# Patient Record
Sex: Female | Born: 1950 | Race: White | Hispanic: No | Marital: Single | Smoking: Never smoker
Health system: Southern US, Community
[De-identification: ages and names within clinical notes are randomized; demographics above are authoritative.]

## PROBLEM LIST (undated history)

## (undated) DIAGNOSIS — M810 Age-related osteoporosis without current pathological fracture: Secondary | ICD-10-CM

## (undated) DIAGNOSIS — E785 Hyperlipidemia, unspecified: Secondary | ICD-10-CM

## (undated) DIAGNOSIS — I341 Nonrheumatic mitral (valve) prolapse: Secondary | ICD-10-CM

## (undated) DIAGNOSIS — M1990 Unspecified osteoarthritis, unspecified site: Secondary | ICD-10-CM

## (undated) DIAGNOSIS — T7840XA Allergy, unspecified, initial encounter: Secondary | ICD-10-CM

## (undated) HISTORY — DX: Allergy, unspecified, initial encounter: T78.40XA

## (undated) HISTORY — PX: TONSILLECTOMY AND ADENOIDECTOMY: SHX28

## (undated) HISTORY — PX: BREAST EXCISIONAL BIOPSY: SUR124

## (undated) HISTORY — DX: Unspecified osteoarthritis, unspecified site: M19.90

## (undated) HISTORY — PX: APPENDECTOMY: SHX54

## (undated) HISTORY — PX: VEIN SURGERY: SHX48

## (undated) HISTORY — DX: Age-related osteoporosis without current pathological fracture: M81.0

## (undated) HISTORY — DX: Hyperlipidemia, unspecified: E78.5

## (undated) HISTORY — PX: FOOT SURGERY: SHX648

---

## 2004-05-20 ENCOUNTER — Ambulatory Visit: Payer: Self-pay

## 2005-02-28 ENCOUNTER — Ambulatory Visit: Admission: RE | Admit: 2005-02-28 | Discharge: 2005-02-28 | Payer: Self-pay | Admitting: Gynecologic Oncology

## 2007-07-04 HISTORY — PX: VASCULAR SURGERY: SHX849

## 2011-05-12 ENCOUNTER — Other Ambulatory Visit: Payer: Self-pay | Admitting: Orthopedic Surgery

## 2011-05-12 DIAGNOSIS — M25562 Pain in left knee: Secondary | ICD-10-CM

## 2011-05-13 ENCOUNTER — Other Ambulatory Visit: Payer: Self-pay

## 2011-05-15 ENCOUNTER — Ambulatory Visit
Admission: RE | Admit: 2011-05-15 | Discharge: 2011-05-15 | Disposition: A | Discharge status: Home / Self Care | Payer: BC Managed Care – PPO | Source: Ambulatory Visit | Attending: Orthopedic Surgery | Admitting: Orthopedic Surgery

## 2011-05-15 ENCOUNTER — Other Ambulatory Visit: Payer: Self-pay | Admitting: Orthopedic Surgery

## 2011-05-15 DIAGNOSIS — M25562 Pain in left knee: Secondary | ICD-10-CM

## 2011-05-15 MED ORDER — GADOBENATE DIMEGLUMINE 529 MG/ML IV SOLN
10.0000 mL | Freq: Once | INTRAVENOUS | Status: AC | PRN
  Administered 2011-05-15: 10 mL via INTRAVENOUS

## 2011-05-18 ENCOUNTER — Other Ambulatory Visit: Payer: Self-pay

## 2011-05-18 DIAGNOSIS — E785 Hyperlipidemia, unspecified: Secondary | ICD-10-CM | POA: Insufficient documentation

## 2011-05-18 DIAGNOSIS — I341 Nonrheumatic mitral (valve) prolapse: Secondary | ICD-10-CM | POA: Insufficient documentation

## 2012-04-18 ENCOUNTER — Ambulatory Visit: Payer: Self-pay | Admitting: Internal Medicine

## 2012-04-19 ENCOUNTER — Ambulatory Visit: Payer: Self-pay | Admitting: Internal Medicine

## 2012-04-24 ENCOUNTER — Other Ambulatory Visit: Payer: Self-pay | Admitting: Internal Medicine

## 2012-04-24 DIAGNOSIS — IMO0002 Reserved for concepts with insufficient information to code with codable children: Secondary | ICD-10-CM

## 2012-04-29 ENCOUNTER — Other Ambulatory Visit: Payer: Self-pay | Admitting: Internal Medicine

## 2012-04-29 DIAGNOSIS — IMO0002 Reserved for concepts with insufficient information to code with codable children: Secondary | ICD-10-CM

## 2012-04-30 ENCOUNTER — Ambulatory Visit
Admission: RE | Admit: 2012-04-30 | Discharge: 2012-04-30 | Disposition: A | Discharge status: Home / Self Care | Payer: BC Managed Care – PPO | Source: Ambulatory Visit | Attending: Internal Medicine | Admitting: Internal Medicine

## 2012-04-30 DIAGNOSIS — IMO0002 Reserved for concepts with insufficient information to code with codable children: Secondary | ICD-10-CM

## 2012-04-30 MED ORDER — GADOBENATE DIMEGLUMINE 529 MG/ML IV SOLN
12.0000 mL | Freq: Once | INTRAVENOUS | Status: AC | PRN
  Administered 2012-04-30: 12 mL via INTRAVENOUS

## 2012-06-24 ENCOUNTER — Ambulatory Visit: Payer: Self-pay | Admitting: Unknown Physician Specialty

## 2012-06-25 LAB — PATHOLOGY REPORT

## 2013-11-25 ENCOUNTER — Ambulatory Visit (INDEPENDENT_AMBULATORY_CARE_PROVIDER_SITE_OTHER): Payer: BC Managed Care – PPO | Admitting: Podiatry

## 2013-11-25 ENCOUNTER — Encounter: Payer: Self-pay | Admitting: Podiatry

## 2013-11-25 ENCOUNTER — Ambulatory Visit (INDEPENDENT_AMBULATORY_CARE_PROVIDER_SITE_OTHER): Payer: BC Managed Care – PPO

## 2013-11-25 VITALS — BP 116/84 | HR 70 | Resp 16

## 2013-11-25 DIAGNOSIS — M21619 Bunion of unspecified foot: Secondary | ICD-10-CM

## 2013-11-25 DIAGNOSIS — M7750 Other enthesopathy of unspecified foot: Secondary | ICD-10-CM

## 2013-11-25 NOTE — Progress Notes (Signed)
Subjective:     Patient ID: Rebekah Shaw, female   DOB: [birth year], 63 y.o.   MRN: [id number]  HPI patient presents stating that I get pain in my right foot at times and tiredness but I have note trouble with the pain or no trouble with the structural correction   Review of Systems  All other systems reviewed and are negative.      Objective:   Physical Exam  Nursing note and vitals reviewed. Cardiovascular: Intact distal pulses.   Musculoskeletal: Normal range of motion.   neurovascular status is intact and I noted that the incision site on the first metatarsal right and left foot is healing very well with no prominent pin position or irritation noted. Mild discomfort underneath the lesser metatarsophalangeal joints diffuse in nature which is generally been taken care of by orthotics which have started to wear out at this time     Assessment:     Doing well structural correction of both feet with inflammation noted in the lesser metatarsals right over left    Plan:     Reviewed condition and x-rays of right foot and went ahead today and scanned for custom orthotic devices. Reappoint when those are returned

## 2013-12-03 ENCOUNTER — Encounter: Payer: Self-pay | Admitting: *Deleted

## 2013-12-03 NOTE — Progress Notes (Signed)
Sent pt post card letting her know orthotics are here. 

## 2013-12-05 ENCOUNTER — Encounter: Payer: Self-pay | Admitting: Podiatry

## 2014-11-27 DIAGNOSIS — E559 Vitamin D deficiency, unspecified: Secondary | ICD-10-CM | POA: Insufficient documentation

## 2015-10-19 ENCOUNTER — Inpatient Hospital Stay
Admission: RE | Admit: 2015-10-19 | Discharge: 2015-10-19 | Disposition: A | Discharge status: Home / Self Care | Payer: Self-pay | Source: Ambulatory Visit | Attending: *Deleted | Admitting: *Deleted

## 2015-10-19 ENCOUNTER — Other Ambulatory Visit: Payer: Self-pay | Admitting: *Deleted

## 2015-10-19 ENCOUNTER — Other Ambulatory Visit: Payer: Self-pay | Admitting: Internal Medicine

## 2015-10-19 DIAGNOSIS — Z1231 Encounter for screening mammogram for malignant neoplasm of breast: Secondary | ICD-10-CM

## 2015-10-19 DIAGNOSIS — Z9289 Personal history of other medical treatment: Secondary | ICD-10-CM

## 2015-10-26 ENCOUNTER — Other Ambulatory Visit: Payer: Self-pay | Admitting: Internal Medicine

## 2015-10-26 ENCOUNTER — Ambulatory Visit
Admission: RE | Admit: 2015-10-26 | Discharge: 2015-10-26 | Disposition: A | Discharge status: Home / Self Care | Payer: BLUE CROSS/BLUE SHIELD | Source: Ambulatory Visit | Attending: Internal Medicine | Admitting: Internal Medicine

## 2015-10-26 DIAGNOSIS — Z1231 Encounter for screening mammogram for malignant neoplasm of breast: Secondary | ICD-10-CM | POA: Insufficient documentation

## 2016-11-16 ENCOUNTER — Other Ambulatory Visit: Payer: Self-pay | Admitting: Internal Medicine

## 2016-11-16 DIAGNOSIS — Z1231 Encounter for screening mammogram for malignant neoplasm of breast: Secondary | ICD-10-CM

## 2016-12-19 ENCOUNTER — Ambulatory Visit
Admission: RE | Admit: 2016-12-19 | Discharge: 2016-12-19 | Disposition: A | Discharge status: Home / Self Care | Payer: Medicare HMO | Source: Ambulatory Visit | Attending: Internal Medicine | Admitting: Internal Medicine

## 2016-12-19 DIAGNOSIS — Z1231 Encounter for screening mammogram for malignant neoplasm of breast: Secondary | ICD-10-CM | POA: Diagnosis present

## 2017-03-13 ENCOUNTER — Other Ambulatory Visit: Payer: Self-pay | Admitting: Internal Medicine

## 2017-03-13 DIAGNOSIS — R29898 Other symptoms and signs involving the musculoskeletal system: Secondary | ICD-10-CM

## 2017-03-23 ENCOUNTER — Ambulatory Visit
Admission: RE | Admit: 2017-03-23 | Discharge: 2017-03-23 | Disposition: A | Discharge status: Home / Self Care | Payer: Medicare HMO | Source: Ambulatory Visit | Attending: Internal Medicine | Admitting: Internal Medicine

## 2017-03-23 DIAGNOSIS — R29898 Other symptoms and signs involving the musculoskeletal system: Secondary | ICD-10-CM

## 2017-07-03 DIAGNOSIS — M1990 Unspecified osteoarthritis, unspecified site: Secondary | ICD-10-CM

## 2017-07-03 HISTORY — DX: Unspecified osteoarthritis, unspecified site: M19.90

## 2017-08-17 ENCOUNTER — Ambulatory Visit: Payer: Medicare HMO | Admitting: Podiatry

## 2017-08-17 ENCOUNTER — Encounter: Payer: Self-pay | Admitting: Podiatry

## 2017-08-17 ENCOUNTER — Ambulatory Visit (INDEPENDENT_AMBULATORY_CARE_PROVIDER_SITE_OTHER): Payer: Medicare HMO

## 2017-08-17 ENCOUNTER — Other Ambulatory Visit: Payer: Self-pay | Admitting: Podiatry

## 2017-08-17 DIAGNOSIS — M79671 Pain in right foot: Secondary | ICD-10-CM

## 2017-08-17 DIAGNOSIS — R600 Localized edema: Secondary | ICD-10-CM

## 2017-08-18 NOTE — Progress Notes (Signed)
Subjective:   Patient ID: Rebekah Shaw, female   DOB: 67 y.o.   MRN: [id number]   HPI Patient presents stating that she started to develop discomfort in her right foot and she has a history of lymphedema that she utilizes a compression device for.  States her foot has started to swell over the last few days and does not remember specific injury   Review of Systems  All other systems reviewed and are negative.       Objective:  Physical Exam  Constitutional: She appears well-developed and well-nourished.  Cardiovascular: Intact distal pulses.  Pulmonary/Chest: Effort normal.  Musculoskeletal: Normal range of motion.  Neurological: She is alert.  Skin: Skin is warm.  Nursing note and vitals reviewed.   Neurovascular status found to be intact muscle strength is adequate range of motion was within normal limits with negative Homans sign noted and patient found to have quite a bit of edema in the right foot and into the ankle that is localized with no breakage of skin or indications of pathology.  Structural bunion correction right looks good with good alignment noted     Assessment:  Swelling which may be due to lymphedema or other pathology with no indications currently of any kind of blockage with good structural correction of bunion     Plan:  H&P education rendered and today I went ahead and applied an Unna boot Ace wrap to try to compress the area and take the swelling out.  I instructed on leaving it on for 4 days and I gave strict instructions if she should develop any discomfort or proximal swelling or any indications of clot or any shortness of breath she is to go straight to the emergency room.  Patient will be seen back if symptoms persist  X-rays indicate good alignment first metatarsal with excellent correction from previous surgery that was done years ago

## 2017-08-23 ENCOUNTER — Encounter: Payer: Self-pay | Admitting: Podiatry

## 2017-08-23 ENCOUNTER — Ambulatory Visit: Payer: Medicare HMO | Admitting: Podiatry

## 2017-08-23 DIAGNOSIS — M84374D Stress fracture, right foot, subsequent encounter for fracture with routine healing: Secondary | ICD-10-CM | POA: Diagnosis not present

## 2017-08-23 DIAGNOSIS — M79671 Pain in right foot: Secondary | ICD-10-CM

## 2017-08-23 DIAGNOSIS — S93601D Unspecified sprain of right foot, subsequent encounter: Secondary | ICD-10-CM

## 2017-08-23 NOTE — Progress Notes (Addendum)
This patient presents to the office at [facility] insisting on an evaluation of her right foot.  She says she was seen by Dr. Charlsie [place] in [place] 4 days ago.  He was concerned about her swelling and lymphedema and recommended she wear an Radio broadcast assistantUnna boot for 4 days.  She presents the office today to have her Unna boot removed.  Upon removal, there was no evidence of any redness or swelling persisting in her right foot.  She says she was dispensed a medium-sized surgical shoe and she had difficulty keeping her balance since she believes that she needed a small size surgical shoe.  She presents the office to exchange her surgical shoe today.  She also requests a reevaluation of her right foot since pain persists.  She is presently using a pump for her lymphedma.  General Appearance  Alert, conversant and in no acute stress.  Vascular  Dorsalis pedis and posterior pulses are palpable  bilaterally.  Capillary return is within normal limits  bilaterally. Temperature is within normal limits  Bilaterally.  Neurologic  Senn-Weinstein monofilament wire test within normal limits  bilaterally. Muscle power within normal limits bilaterally.  Nails Normotropic nails noted with no evidence of fungal or bacterial infection.  Orthopedic  No limitations of motion of motion feet bilaterally.  No crepitus or effusions noted.  No bony pathology or digital deformities noted. Examination of her right foot reveals palpable pain noted to the cuboid and along the plantar aspect of the fifth metatarsal right foot.  There is also palpable pain noted midshaft second metatarsal right foot.  No evidence of any swelling noted.  Skin  normotropic skin with no porokeratosis noted bilaterally.  No signs of infections or ulcers noted.    Foot Sprain right foot  ROV  Reexamination of the x-ray of the AP  Xray reveals an enlarged cuboid bone.  Lateral reveals a normal appearing  Cuboid.  Patient does have pain and discomfort in the cuboid area  and along the shaft of the fifth metatarsal.  She denies any trauma or injury to this foot.  All swelling has resolved with the [employer] LockerUnna boot.  I recommended plantar fascia brace to be worn on her right foot to help lock the midfoot complex during gait.  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  This could help explain the pain in the second metatarsal midshaft.  She could have been compensating for the cuboid syndrome and placing her weight through the medial aspect of the foot.  This would explain the swelling at the first metatarsal.  [facility]  She was told to wear her plantar fascial brace as well her compression sock.  Compression sock is to limit her previous swelling and help stailize her right foot RTC 10 days.

## 2017-08-29 ENCOUNTER — Ambulatory Visit (INDEPENDENT_AMBULATORY_CARE_PROVIDER_SITE_OTHER): Payer: Medicare HMO | Admitting: Orthotics

## 2017-08-29 DIAGNOSIS — M79676 Pain in unspecified toe(s): Secondary | ICD-10-CM

## 2017-08-29 DIAGNOSIS — M84374D Stress fracture, right foot, subsequent encounter for fracture with routine healing: Secondary | ICD-10-CM

## 2017-08-29 DIAGNOSIS — S93601D Unspecified sprain of right foot, subsequent encounter: Secondary | ICD-10-CM

## 2017-08-29 DIAGNOSIS — M79671 Pain in right foot: Secondary | ICD-10-CM

## 2017-08-29 NOTE — Progress Notes (Signed)
Patient presented today for CMFO to address cuboid syndrome (R).  Patient was confused re whether insurance would pay as she has Medicare advantage plan.  She eventually decided to pay $300 (125 down).  [place] to fab

## 2017-09-06 ENCOUNTER — Ambulatory Visit: Payer: Medicare HMO | Admitting: Podiatry

## 2017-09-10 ENCOUNTER — Ambulatory Visit: Payer: Medicare HMO | Admitting: Podiatry

## 2017-09-10 ENCOUNTER — Encounter: Payer: Self-pay | Admitting: Podiatry

## 2017-09-10 DIAGNOSIS — M79671 Pain in right foot: Secondary | ICD-10-CM

## 2017-09-10 DIAGNOSIS — M2041 Other hammer toe(s) (acquired), right foot: Secondary | ICD-10-CM

## 2017-09-10 DIAGNOSIS — M2011 Hallux valgus (acquired), right foot: Secondary | ICD-10-CM | POA: Diagnosis not present

## 2017-09-10 NOTE — Progress Notes (Signed)
This patient returns to the office follow-up for an evaluation of her right foot.  She was diagnosed by myself as having a cuboid syndrome.   Patient states that she has been wearing her compression sock with her surgical shoe. She was unable to wearantar fascial brace on her right foot.   She says that she is under percent improved and all the pain has resolved.  She also is having no pain or discomfort at the second metatarsal of the right foot.  Patient is concerned about her big toe and her second toe right foot.  She says she previously had bunion surgery years ago and she is concerned that the bunion is returning.  She says the big toe on the right foot is drawing towards the second toe on the right foot and causing the second toe to be lifted.She is concerned that this will continue to develop leading to future problems.  She returns to the office for continued evaluation  and treatment of her right foot   General Appearance  Alert, conversant and in no acute stress.  Vascular  Dorsalis pedis and posterior tibial  pulses are palpable  bilaterally.  Capillary return is within normal limits  bilaterally. Temperature is within normal limits  bilaterally.  Neurologic  Senn-Weinstein monofilament wire test within normal limits  bilaterally. Muscle power within normal limits bilaterally.  Nails Thick disfigured discolored nails with subungual debris  from hallux to fifth toes bilaterally. No evidence of bacterial infection or drainage bilaterally.  Orthopedic  No limitations of motion of motion feet .  No crepitus or effusions noted.  Minimal  pain noted at the base of the fifth metatarsal and cuboid bone, right foot.   No evidence of any swelling.  No pain or swelling noted second metatarsal right foot.  Mild hallux interphalangeus with dorsiflexed position second toe.    Skin  normotropic skin with no porokeratosis noted bilaterally.  No signs of infections or ulcers noted.    S/P foot sprain   Hallux interphalangeus and hammer toe second toe right foot.  ROV.  Evaluation of her foot sprain reveals marked improvement.  She desired to discuss her forefoot pathology, which included her second digit right foot.  I recommended she buddy tape her second to her third toe, right foot.  After putting it on. She says there is no way she could buddy tape her toes herself.  We discussed surgical correction in the future.  Finally, she was told to continue wearing the compression sock with her footgear on her right foot.  Return to clinic when necessary   Rebekah Shaw DPM

## 2017-09-19 ENCOUNTER — Ambulatory Visit (INDEPENDENT_AMBULATORY_CARE_PROVIDER_SITE_OTHER): Payer: Medicare HMO | Admitting: Orthotics

## 2017-09-19 DIAGNOSIS — M84374D Stress fracture, right foot, subsequent encounter for fracture with routine healing: Secondary | ICD-10-CM

## 2017-09-19 DIAGNOSIS — M2041 Other hammer toe(s) (acquired), right foot: Secondary | ICD-10-CM

## 2017-09-19 NOTE — Progress Notes (Signed)
Patient came in today to pick up custom made foot orthotics.  The goals were accomplished and the patient reported no dissatisfaction with said orthotics.  Patient was advised of breakin period and how to report any issues. 

## 2017-11-23 ENCOUNTER — Other Ambulatory Visit: Payer: Self-pay | Admitting: Student

## 2017-11-23 DIAGNOSIS — M545 Low back pain: Principal | ICD-10-CM

## 2017-11-23 DIAGNOSIS — G8929 Other chronic pain: Secondary | ICD-10-CM

## 2017-12-12 ENCOUNTER — Other Ambulatory Visit: Payer: Self-pay | Admitting: Internal Medicine

## 2017-12-12 DIAGNOSIS — Z1231 Encounter for screening mammogram for malignant neoplasm of breast: Secondary | ICD-10-CM

## 2018-01-01 ENCOUNTER — Ambulatory Visit
Admission: RE | Admit: 2018-01-01 | Discharge: 2018-01-01 | Disposition: A | Discharge status: Home / Self Care | Payer: Medicare HMO | Source: Ambulatory Visit | Attending: Internal Medicine | Admitting: Internal Medicine

## 2018-01-01 DIAGNOSIS — Z1231 Encounter for screening mammogram for malignant neoplasm of breast: Secondary | ICD-10-CM | POA: Diagnosis present

## 2018-01-10 ENCOUNTER — Other Ambulatory Visit: Payer: Self-pay

## 2018-01-10 ENCOUNTER — Ambulatory Visit
Payer: Medicare HMO | Attending: Student in an Organized Health Care Education/Training Program | Admitting: Student in an Organized Health Care Education/Training Program

## 2018-01-10 ENCOUNTER — Encounter: Payer: Self-pay | Admitting: Student in an Organized Health Care Education/Training Program

## 2018-01-10 VITALS — BP 133/81 | HR 80 | Temp 98.2°F | Resp 16 | Ht 60.0 in | Wt 150.0 lb

## 2018-01-10 DIAGNOSIS — M47816 Spondylosis without myelopathy or radiculopathy, lumbar region: Secondary | ICD-10-CM | POA: Insufficient documentation

## 2018-01-10 DIAGNOSIS — M5416 Radiculopathy, lumbar region: Secondary | ICD-10-CM | POA: Diagnosis not present

## 2018-01-10 DIAGNOSIS — X58XXXS Exposure to other specified factors, sequela: Secondary | ICD-10-CM | POA: Insufficient documentation

## 2018-01-10 DIAGNOSIS — Z791 Long term (current) use of non-steroidal anti-inflammatories (NSAID): Secondary | ICD-10-CM | POA: Insufficient documentation

## 2018-01-10 DIAGNOSIS — Z9889 Other specified postprocedural states: Secondary | ICD-10-CM | POA: Insufficient documentation

## 2018-01-10 DIAGNOSIS — S32050S Wedge compression fracture of fifth lumbar vertebra, sequela: Secondary | ICD-10-CM | POA: Insufficient documentation

## 2018-01-10 DIAGNOSIS — M4856XS Collapsed vertebra, not elsewhere classified, lumbar region, sequela of fracture: Secondary | ICD-10-CM | POA: Diagnosis not present

## 2018-01-10 DIAGNOSIS — M25551 Pain in right hip: Secondary | ICD-10-CM | POA: Insufficient documentation

## 2018-01-10 DIAGNOSIS — G894 Chronic pain syndrome: Secondary | ICD-10-CM | POA: Insufficient documentation

## 2018-01-10 DIAGNOSIS — Z79899 Other long term (current) drug therapy: Secondary | ICD-10-CM | POA: Diagnosis not present

## 2018-01-10 DIAGNOSIS — M5136 Other intervertebral disc degeneration, lumbar region: Secondary | ICD-10-CM | POA: Insufficient documentation

## 2018-01-10 DIAGNOSIS — M4726 Other spondylosis with radiculopathy, lumbar region: Secondary | ICD-10-CM | POA: Insufficient documentation

## 2018-01-10 MED ORDER — GABAPENTIN 100 MG PO CAPS: ORAL_CAPSULE | ORAL | 0 refills | Status: DC

## 2018-01-10 MED ORDER — DICLOFENAC SODIUM 75 MG PO TBEC: 75.0000 mg | DELAYED_RELEASE_TABLET | Freq: Two times a day (BID) | ORAL | 0 refills | Status: DC

## 2018-01-10 MED ORDER — OMEPRAZOLE 20 MG PO CPDR: 20.0000 mg | DELAYED_RELEASE_CAPSULE | Freq: Every day | ORAL | 1 refills | Status: DC

## 2018-01-10 NOTE — Progress Notes (Signed)
Patient's Name: Rebekah Shaw  MRN: [id number]  Referring Provider: Marin Olp, PA-C  DOB: 18-Feb-1951  PCP: Adin Hector, MD  DOS: 01/10/2018  Note by: Gillis Santa, MD  Service setting: Ambulatory outpatient  Specialty: Interventional Pain Management  Location: [facility] (AMB) Pain Management Facility  Visit type: Initial Patient Evaluation  Patient type: New Patient   Primary Reason(s) for Visit: Encounter for initial evaluation of one or more chronic problems (new to examiner) potentially causing chronic pain, and posing a threat to normal musculoskeletal function. (Level of risk: High) CC: Back Pain (lower, right) and Hip Pain (hip)  HPI  [name] is a 67 y.o. year old, female patient, who comes today to see [place] for the first time for an initial evaluation of her chronic pain. She has Lumbar radiculopathy (Right L4/5); Lumbar spondylosis; Lumbar facet arthropathy; Lumbar degenerative disc disease; Compression fracture of L5 lumbar vertebra, sequela; and Chronic pain syndrome on their problem list. Today she comes in for evaluation of her Back Pain (lower, right) and Hip Pain (hip)  Pain Assessment: Location: Right, Lower Back Radiating: through right hip down side of right leg to foot, including all toes Onset: More than a month ago Duration: Chronic pain Quality: Aching, Throbbing, Shooting, Constant Severity: 9 /10 (subjective, self-reported pain score)  Note: Reported level is compatible with observation.                         When using our objective Pain Scale, levels between 6 and 10/10 are said to belong in an emergency room, as it progressively worsens from a 6/10, described as severely limiting, requiring emergency care not usually available at an outpatient pain management facility. At a 6/10 level, communication becomes difficult and requires great effort. Assistance to reach the emergency department may be required. Facial flushing and profuse sweating along with potentially  dangerous increases in heart rate and blood pressure will be evident. Effect on ADL: requiring walk aids, has gained 30# in past year due to not able to exercise r/t pain, difficult to navigate steps in pt's 2 story house Timing: Constant Modifying factors: voltaren cream, ibuprofen 800 mg BP: 133/81  HR: 80  Onset and Duration: Date of onset: 10/2016 and Present longer than 3 months Cause of pain: Work related accident or event Severity: Getting worse and No change since onset Timing: Morning, Afternoon and Night Aggravating Factors: Bending, Lifiting, Motion, Prolonged sitting, Prolonged standing, Squatting, Twisting, Walking, Walking uphill, Walking downhill and Working Alleviating Factors: Lying down Associated Problems: Constipation, Day-time cramps, Night-time cramps, Fatigue, Inability to control bladder (urine), Numbness, Personality changes, Sweating, Swelling, Temperature changes, Weakness and Pain that wakes patient up Quality of Pain: Aching, Exhausting, Feeling of weight, Getting longer, Getting shorter, Itching, Sharp, Sickening, Splitting, Stabbing, Tender, Throbbing, Tiring, Uncomfortable and Work related Previous Examinations or Tests: MRI scan Previous Treatments: The patient denies none listed  The patient comes into the clinics today for the first time for a chronic pain management evaluation.   67 year old female with a chief complaint of back pain that radiates into her right leg that is been present for greater than 2 years that has really worsened after a re-injury .  Patient works at [employer] and finds it very difficult to stand freely without leaning on the counter.  She has difficulty applying weight to her right side.  She is status post left intra-articular hip injection with Dr. Sharlet Salina in February 2019 which was not  beneficial.  Patient states that her in terms of medications, patient is tried baclofen which is resulted in nausea and prednisone which resulted in  diarrhea and vomiting.  Patient denies receiving any epidural injections or facet blocks in the past.  She has recently completed a lumbar MRI results which are below.  Patient has been utilizing ibuprofen 600 800 mg twice daily to help manage her pain.  She denies any bowel or bladder dysfunction.  She has difficulty ambulating without an assist device given her right low back hip and radiating leg pain.  She has done physical therapy in the past states that it was not beneficial.  Currently not on any opioid therapy.  PMP reveals no opioid prescriptions.  Today I took the time to provide the patient with information regarding my pain practice. The patient was informed that my practice is divided into two sections: an interventional pain management section, as well as a completely separate and distinct medication management section. I explained that I have procedure days for my interventional therapies, and evaluation days for follow-ups and medication management. Because of the amount of documentation required during both, they are kept separated. This means that there is the possibility that she may be scheduled for a procedure on one day, and medication management the next. I have also informed her that because of staffing and facility limitations, I no longer take patients for medication management only. To illustrate the reasons for this, I gave the patient the example of surgeons, and how inappropriate it would be to refer a patient to his/her care, just to write for the post-surgical antibiotics on a surgery done by a different surgeon.   Because interventional pain management is my board-certified specialty, the patient was informed that joining my practice means that they are open to any and all interventional therapies. I made it clear that this does not mean that they will be forced to have any procedures done. What this means is that I believe interventional therapies to be essential part of the  diagnosis and proper management of chronic pain conditions. Therefore, patients not interested in these interventional alternatives will be better served under the care of a different practitioner.  The patient was also made aware of my Comprehensive Pain Management Safety Guidelines where by joining my practice, they limit all of their nerve blocks and joint injections to those done by our practice, for as long as we are retained to manage their care.   Historic Controlled Substance Pharmacotherapy Review  PMP and historical list of controlled substances: 0  Pharmacodynamics: Desired effects: Reported improvement in function: The patient reports medication allows her to accomplish basic ADLs. Clinically meaningful improvement in function (CMIF): Sustained CMIF goals met Perceived effectiveness: Described as relatively effective, allowing for increase in activities of daily living (ADL) Undesirable effects: Side-effects or Adverse reactions: None reported Historical Monitoring: The patient  reports that she does not use drugs. List of all UDS Test(s): No results found for: MDMA, COCAINSCRNUR, [place], [place], CANNABQUANT, [place], [place] List of other Serum/Urine Drug Screening Test(s):  No results found for: AMPHSCRSER, BARBSCRSER, BENZOSCRSER, COCAINSCRSER, COCAINSCRNUR, PCPSCRSER, PCPQUANT, THCSCRSER, THCU, CANNABQUANT, OPIATESCRSER, OXYSCRSER, PROPOXSCRSER, ETH Historical Background Evaluation: [place] PMP: Six (6) year initial data search conducted.             [place] Department of public safety, offender search: Editor, commissioning Information) Non-contributory Risk Assessment Profile: Aberrant behavior: None observed or detected today Risk factors for fatal opioid overdose: None identified today Fatal overdose hazard  ratio (HR): Calculation deferred Non-fatal overdose hazard ratio (HR): Calculation deferred Risk of opioid abuse or dependence: 0.7-3.0% with doses ? 36 MME/day and 6.1-26% with doses ? 120  MME/day. Substance use disorder (SUD) risk level: Low Opioid risk tool (ORT) (Total Score): 0 Opioid Risk Tool - 01/10/18 0820      Family History of Substance Abuse   Alcohol  Negative    Illegal Drugs  Negative    Rx Drugs  Negative      Personal History of Substance Abuse   Alcohol  Negative    Illegal Drugs  Negative    Rx Drugs  Negative      Age   Age between 54-45 years   No      History of Preadolescent Sexual Abuse   History of Preadolescent Sexual Abuse  Negative or Female      Psychological Disease   Psychological Disease  Negative    Depression  Negative      Total Score   Opioid Risk Tool Scoring  0    Opioid Risk Interpretation  Low Risk      ORT Scoring interpretation table:  Score <3 = Low Risk for SUD  Score between 4-7 = Moderate Risk for SUD  Score >8 = High Risk for Opioid Abuse   PHQ-2 Depression Scale:  Total score: 0  PHQ-2 Scoring interpretation table: (Score and probability of major depressive disorder)  Score 0 = No depression  Score 1 = 15.4% Probability  Score 2 = 21.1% Probability  Score 3 = 38.4% Probability  Score 4 = 45.5% Probability  Score 5 = 56.4% Probability  Score 6 = 78.6% Probability   PHQ-9 Depression Scale:  Total score: 0  PHQ-9 Scoring interpretation table:  Score 0-4 = No depression  Score 5-9 = Mild depression  Score 10-14 = Moderate depression  Score 15-19 = Moderately severe depression  Score 20-27 = Severe depression (2.4 times higher risk of SUD and 2.89 times higher risk of overuse)   Pharmacologic Plan: As per protocol, I have not taken over any controlled substance management, pending the results of ordered tests and/or consults.            Initial impression: Pending review of available data and ordered tests.  Meds   Current Outpatient Medications:  .  b complex vitamins tablet, Take 1 tablet by mouth daily., Disp: , Rfl:  .  BIOTIN 5000 PO, Take by mouth., Disp: , Rfl:  .  diclofenac sodium (VOLTAREN)  1 % GEL, Apply topically 4 (four) times daily., Disp: , Rfl:  .  fexofenadine (ALLEGRA) 180 MG tablet, Take 180 mg by mouth daily., Disp: , Rfl:  .  ibuprofen (ADVIL,MOTRIN) 800 MG tablet, Take 800 mg by mouth every 8 (eight) hours as needed., Disp: , Rfl:  .  Magnesium 400 MG CAPS, Take by mouth., Disp: , Rfl:  .  multivitamin-iron-minerals-folic acid (CENTRUM) chewable tablet, Chew 1 tablet by mouth daily., Disp: , Rfl:  .  Omega-3 Fatty Acids (FISH OIL) 1200 MG CAPS, Take by mouth., Disp: , Rfl:  .  UNABLE TO FIND, Take by mouth. folic acid/multivit-min/lutein (CENTRUM SILVER ORAL), Disp: , Rfl:  .  VITAMIN D, CHOLECALCIFEROL, PO, Take by mouth., Disp: , Rfl:  .  diclofenac (VOLTAREN) 75 MG EC tablet, Take 1 tablet (75 mg total) by mouth 2 (two) times daily after a meal., Disp: 60 tablet, Rfl: 0 .  gabapentin (NEURONTIN) 100 MG capsule, 100 mg qhs x 1  week, then 200 mg qhs for week 2, 300 mg qhs week 3, Disp: 90 capsule, Rfl: 0 .  omeprazole (PRILOSEC) 20 MG capsule, Take 1 capsule (20 mg total) by mouth daily., Disp: 30 capsule, Rfl: 1  Imaging Review   Lumbosacral Imaging: Lumbar MR wo contrast:  Results for orders placed during the hospital encounter of 03/23/17  MR LUMBAR SPINE WO CONTRAST   Narrative CLINICAL DATA:  Chronic low back pain, bilateral leg pain and swelling.  EXAM: MRI LUMBAR SPINE WITHOUT CONTRAST  TECHNIQUE: Multiplanar, multisequence MR imaging of the lumbar spine was performed. No intravenous contrast was administered.  COMPARISON:  CT abdomen and pelvis April 18, 2012  FINDINGS: SEGMENTATION: For the purposes of this report, the last well-formed intervertebral disc will be reported as L5-S1.  ALIGNMENT: Maintained lumbar lordosis. Minimal grade 1 L3-4 anterolisthesis. No spondylolysis. Mild broad dextroscoliosis inferred on axial sequences.  VERTEBRAE:Mild chronic L1 compression fracture with less than 20% superior endplate height loss. Lumbar  vertebral bodies are otherwise intact. Mild L3-4 disc height loss and chronic discogenic endplate changes. Disc desiccation all lumbar levels. No suspicious or acute bone marrow signal. 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  CONUS MEDULLARIS: Conus medullaris terminates at L1-2 and demonstrates normal morphology and signal characteristics. Cauda equina is normal.  PARASPINAL AND SOFT TISSUES: Included prevertebral and paraspinal soft tissues are nonacute. Mild rotated LEFT kidney.  DISC LEVELS:  T12-L1 thru L2-3: No disc bulge, canal stenosis nor neural foraminal narrowing.  L3-4: Anterolisthesis. Unroofing of the disc. Moderate facet arthropathy and ligamentum flavum redundancy resulting in mild canal stenosis. No neural foraminal narrowing.  L4-5: No disc bulge. Mild facet arthropathy and ligamentum flavum redundancy. 4 mm LEFT facet synovial cyst within paraspinal soft tissues. No canal stenosis or neural foraminal narrowing.  L5-S1: No disc bulge, canal stenosis nor neural foraminal narrowing. Moderate RIGHT facet arthropathy.  IMPRESSION: 1. Minimal grade 1 L3-4 anterolisthesis on degenerative basis. No spondylolysis. 2. Mild old L5 compression fracture. 3. Mild canal stenosis L3-4. No neural foraminal narrowing at any level.   Electronically Signed   By: Elon Alas M.D.   On: 03/24/2017 02:25    Knee-L MR wo contrast:  Results for orders placed during the hospital encounter of 05/15/11  MR Knee Left W Wo Contrast   Narrative *RADIOLOGY REPORT*  Clinical Data: Pain and swelling of the left knee for 3 weeks. Firm mass superior to the knee.  MRI OF THE LEFT KNEE WITHOUT AND WITH CONTRAST  Technique:  Multiplanar, multisequence MR imaging was performed both before and after administration of intravenous contrast.  Contrast: 93m MULTIHANCE GADOBENATE DIMEGLUMINE 529 MG/ML IV SOLN  Comparison: None.  Findings: The scan extends from 14 cm above the knee joint to 6  cm below the knee joint.  There is no soft tissue or osseous mass.  No pathologic enhancement after contrast administration.  Menisci are normal.  Cruciate and collateral ligaments are normal. Distal quadriceps tendon and patellar tendon are normal.  No joint effusion or popliteal cyst.  No osseous or soft tissue edema.  IMPRESSION: Normal MRI of the left knee and distal left thigh.  Original Report Authenticated By: JLarey Seat M.D.    Foot Imaging: Foot-R DG Complete:  Results for orders placed in visit on 08/17/17  DG Foot Complete Right   Narrative Please see detailed radiograph report in office note.    Hand-L DG Complete: No results found for this or any previous visit.  Complexity Note: Imaging results  reviewed. Results shared with Ms. Leppanen, using Layman's terms.                         ROS  Cardiovascular: No reported cardiovascular signs or symptoms such as High blood pressure, coronary artery disease, abnormal heart rate or rhythm, heart attack, blood thinner therapy or heart weakness and/or failure Pulmonary or Respiratory: No reported pulmonary signs or symptoms such as wheezing and difficulty taking a deep full breath (Asthma), difficulty blowing air out (Emphysema), coughing up mucus (Bronchitis), persistent dry cough, or temporary stoppage of breathing during sleep Neurological: No reported neurological signs or symptoms such as seizures, abnormal skin sensations, urinary and/or fecal incontinence, being born with an abnormal open spine and/or a tethered spinal cord Review of Past Neurological Studies: No results found for this or any previous visit. Psychological-Psychiatric: Anxiousness Gastrointestinal: Irregular, infrequent bowel movements (Constipation) Genitourinary: No reported renal or genitourinary signs or symptoms such as difficulty voiding or producing urine, peeing blood, non-functioning kidney, kidney stones, difficulty emptying the bladder,  difficulty controlling the flow of urine, or chronic kidney disease Hematological: No reported hematological signs or symptoms such as prolonged bleeding, low or poor functioning platelets, bruising or bleeding easily, hereditary bleeding problems, low energy levels due to low hemoglobin or being anemic Endocrine: No reported endocrine signs or symptoms such as high or low blood sugar, rapid heart rate due to high thyroid levels, obesity or weight gain due to slow thyroid or thyroid disease Rheumatologic: Joint aches and or swelling due to excess weight (Osteoarthritis) Musculoskeletal: Negative for myasthenia gravis, muscular dystrophy, multiple sclerosis or malignant hyperthermia Work History: Working full time  Allergies  Ms. Layne is allergic to prednisone; atorvastatin; ezetimibe; ibandronic acid; pravastatin; rosuvastatin; simvastatin; and meloxicam.  Laboratory Chemistry  Inflammation Markers (CRP: Acute Phase) (ESR: Chronic Phase) No results found for: CRP, ESRSEDRATE, LATICACIDVEN                       Rheumatology Markers No results found for: RF, ANA, LABURIC, URICUR, LYMEIGGIGMAB, LYMEABIGMQN, HLAB27                      Renal Function Markers No results found for: BUN, CREATININE, BCR, GFRAA, GFRNONAA                           Hepatic Function Markers No results found for: AST, ALT, ALBUMIN, ALKPHOS, HCVAB, AMYLASE, LIPASE, AMMONIA                      Electrolytes No results found for: NA, K, CL, CALCIUM, MG, PHOS                      Neuropathy Markers No results found for: VITAMINB12, FOLATE, HGBA1C, HIV                      Bone Pathology Markers No results found for: VD25OH, [id number], [id number], [id number], 25OHVITD1, 25OHVITD2, 25OHVITD3, TESTOFREE, TESTOSTERONE                       Coagulation Parameters No results found for: INR, LABPROT, APTT, PLT, DDIMER                      Cardiovascular Markers No results found for: BNP, CKTOTAL, CKMB, TROPONINI, HGB,  HCT  CA Markers No results found for: CEA, CA125, LABCA2                      Note: Lab results reviewed.  PFSH  Drug: Ms. Wagler  reports that she does not use drugs. Alcohol:  reports that she does not drink alcohol. Tobacco:  reports that she has never smoked. She has never used smokeless tobacco. Medical:  has a past medical history of Allergy, Arthritis (2019), Hyperlipidemia, and Osteoporosis. Family: family history includes Cancer in her father.  Past Surgical History:  Procedure Laterality Date  . APPENDECTOMY    . FOOT SURGERY    . TONSILLECTOMY AND ADENOIDECTOMY    . VASCULAR SURGERY Bilateral 2009   legs   Active Ambulatory Problems    Diagnosis Date Noted  . Lumbar radiculopathy (Right L4/5) 01/10/2018  . Lumbar spondylosis 01/10/2018  . Lumbar facet arthropathy 01/10/2018  . Lumbar degenerative disc disease 01/10/2018  . Compression fracture of L5 lumbar vertebra, sequela 01/10/2018  . Chronic pain syndrome 01/10/2018   Resolved Ambulatory Problems    Diagnosis Date Noted  . No Resolved Ambulatory Problems   Past Medical History:  Diagnosis Date  . Allergy   . Arthritis 2019  . Hyperlipidemia   . Osteoporosis    Constitutional Exam  General appearance: Well nourished, well developed, and well hydrated. In no apparent acute distress Vitals:   01/10/18 0754  BP: 133/81  Pulse: 80  Resp: 16  Temp: 98.2 F (36.8 C)  TempSrc: Oral  SpO2: 97%  Weight: 150 lb (68 kg)  Height: 5' (1.524 m)   BMI Assessment: Estimated body mass index is 29.29 kg/m as calculated from the following:   Height as of this encounter: 5' (1.524 m).   Weight as of this encounter: 150 lb (68 kg).  BMI interpretation table: BMI level Category Range association with higher incidence of chronic pain  <18 kg/m2 Underweight   18.5-24.9 kg/m2 Ideal body weight   25-29.9 kg/m2 Overweight Increased incidence by 20%  30-34.9 kg/m2 Obese (Class I) Increased  incidence by 68%  35-39.9 kg/m2 Severe obesity (Class II) Increased incidence by 136%  >40 kg/m2 Extreme obesity (Class III) Increased incidence by 254%   Patient's current BMI Ideal Body weight  Body mass index is 29.29 kg/m. Ideal body weight: 45.5 kg (100 lb 4.9 oz) Adjusted ideal body weight: 54.5 kg (120 lb 3 oz)   BMI Readings from Last 4 Encounters:  01/10/18 29.29 kg/m   Wt Readings from Last 4 Encounters:  01/10/18 150 lb (68 kg)  Psych/Mental status: Alert, oriented x 3 (person, place, & time)       Eyes: PERLA Respiratory: No evidence of acute respiratory distress  Cervical Spine Area Exam  Skin & Axial Inspection: No masses, redness, edema, swelling, or associated skin lesions Alignment: Symmetrical Functional ROM: Unrestricted ROM      Stability: No instability detected Muscle Tone/Strength: Functionally intact. No obvious neuro-muscular anomalies detected. Sensory (Neurological): Unimpaired Palpation: No palpable anomalies              Upper Extremity (UE) Exam    Side: Right upper extremity  Side: Left upper extremity  Skin & Extremity Inspection: Skin color, temperature, and hair growth are WNL. No peripheral edema or cyanosis. No masses, redness, swelling, asymmetry, or associated skin lesions. No contractures.  Skin & Extremity Inspection: Skin color, temperature, and hair growth are WNL. No peripheral edema or cyanosis. No masses, redness, swelling, asymmetry, or  associated skin lesions. No contractures.  Functional ROM: Unrestricted ROM          Functional ROM: Unrestricted ROM          Muscle Tone/Strength: Functionally intact. No obvious neuro-muscular anomalies detected.  Muscle Tone/Strength: Functionally intact. No obvious neuro-muscular anomalies detected.  Sensory (Neurological): Unimpaired          Sensory (Neurological): Unimpaired          Palpation: No palpable anomalies              Palpation: No palpable anomalies              Provocative Test(s):   Phalen's test: deferred Tinel's test: deferred Apley's scratch test (touch opposite shoulder):  Action 1 (Across chest): deferred Action 2 (Overhead): deferred Action 3 (LB reach): deferred   Provocative Test(s):  Phalen's test: deferred Tinel's test: deferred Apley's scratch test (touch opposite shoulder):  Action 1 (Across chest): deferred Action 2 (Overhead): deferred Action 3 (LB reach): deferred    Thoracic Spine Area Exam  Skin & Axial Inspection: No masses, redness, or swelling Alignment: Symmetrical Functional ROM: Unrestricted ROM Stability: No instability detected Muscle Tone/Strength: Functionally intact. No obvious neuro-muscular anomalies detected. Sensory (Neurological): Unimpaired Muscle strength & Tone: No palpable anomalies  Lumbar Spine Area Exam  Skin & Axial Inspection: No masses, redness, or swelling Alignment: Symmetrical Functional ROM: Pain restricted ROM       Stability: No instability detected Muscle Tone/Strength: Functionally intact. No obvious neuro-muscular anomalies detected. Sensory (Neurological): Articular pain pattern and dermatomal Palpation: No palpable anomalies       Provocative Tests: Lumbar Hyperextension/rotation test: (+) bilaterally for facet joint pain. Lumbar quadrant test (Kemp's test): (+) on the right for foraminal stenosis Lumbar Lateral bending test: (+) ipsilateral radicular pain, on the right. Positive for right-sided foraminal stenosis. Patrick's Maneuver: deferred today                   FABER test: deferred today       Thigh-thrust test: deferred today       S-I compression test: deferred today       S-I distraction test: deferred today        Gait & Posture Assessment  Ambulation: Patient ambulates using a cane Gait: Limited. Using assistive device to ambulate Posture: Difficulty standing up straight, due to pain   Lower Extremity Exam    Side: Right lower extremity  Side: Left lower extremity  Stability: No  instability observed          Stability: No instability observed          Skin & Extremity Inspection: Skin color, temperature, and hair growth are WNL. No peripheral edema or cyanosis. No masses, redness, swelling, asymmetry, or associated skin lesions. No contractures.  Skin & Extremity Inspection: Skin color, temperature, and hair growth are WNL. No peripheral edema or cyanosis. No masses, redness, swelling, asymmetry, or associated skin lesions. No contractures.  Functional ROM: Decreased ROM for all joints of the lower extremity          Functional ROM: Unrestricted ROM                  Muscle Tone/Strength: S1 myotomal weakness (Foot Plantar Flexion)  Muscle Tone/Strength: Functionally intact. No obvious neuro-muscular anomalies detected.  Sensory (Neurological): Dermatomal pain pattern  Sensory (Neurological): Unimpaired  Palpation: No palpable anomalies  Palpation: No palpable anomalies   Assessment  Primary Diagnosis & Pertinent Problem List: The  primary encounter diagnosis was Lumbar radiculopathy (Right L4/5). Diagnoses of Lumbar spondylosis, Lumbar facet arthropathy, Lumbar degenerative disc disease, Compression fracture of L5 lumbar vertebra, sequela, and Chronic pain syndrome were also pertinent to this visit.  Visit Diagnosis (New problems to examiner): 1. Lumbar radiculopathy (Right L4/5)   2. Lumbar spondylosis   3. Lumbar facet arthropathy   4. Lumbar degenerative disc disease   5. Compression fracture of L5 lumbar vertebra, sequela   6. Chronic pain syndrome   General Recommendations: The pain condition that the patient suffers from is best treated with a multidisciplinary approach that involves an increase in physical activity to prevent de-conditioning and worsening of the pain cycle, as well as psychological counseling (formal and/or informal) to address the co-morbid psychological affects of pain. Treatment will often involve judicious use of pain medications and  interventional procedures to decrease the pain, allowing the patient to participate in the physical activity that will ultimately produce long-lasting pain reductions. The goal of the multidisciplinary approach is to return the patient to a higher level of overall function and to restore their ability to perform activities of daily living.  67 year old female with a chief complaint of axial low back pain most pronounced on her right side that radiates into her right hip, right lateral leg down to her calf region in a dermatomal fashion.  Patient also has isolated right hip pain due to right hip osteoarthritis.  Patient has difficulty ambulating and works at [employer] and is on her feet most of the day.  She is status post a left hip intra-articular steroid injection back in February 2019 which was not very helpful.  Patient also has a history of L5 compression fracture.  Given her significant pain and associated disability, patient has experienced a 30 pound weight gain.  She is tried Voltaren gel, ibuprofen which somewhat help manage her pain.  Of note patient sustained a injury to her hip in 2019 has gotten worse over time.  Patient's lumbar MRI from December 05, 2017 shows facet arthropathy and lumbar spondylosis at L3, L4, L5 along with mild anterior displacement of L3 on L4 resulting in asymmetric disc bulges resulting in posterior lateral recesses, left greater than right at L3/4 and L4/5.  In regards to therapeutic options, we discussed lumbar epidural steroid injection on the right side for her right sided lumbar radicular symptoms.  Risks and benefits were discussed.  Patient would like to proceed.  She denies being on any blood thinners.  I have instructed the patient to discontinue her ibuprofen.  We will trial diclofenac 75 mg twice daily for 30 days.  Creatinine and kidney function within normal limits.  Also recommended gabapentin to be titrated up to 300 mill grams nightly (titration instructions  below) to help with her neuropathic pain symptoms.  Given that the patient has experienced gastritis in the past, recommended omeprazole 20 mg daily while she is on diclofenac.  Recommended that she take diclofenac after meals.  Plan: -Right L4-L5 lumbar ESI with sedation -Diclofenac 75 mg twice daily, gabapentin titrated up to 300 mg nightly -Omeprazole 20 mg daily while on diclofenac -UDS today  Ordered Lab-work, Procedure(s), Referral(s), & Consult(s): Orders Placed This Encounter  Procedures  . Lumbar Epidural Injection  . Compliance Drug Analysis, Ur   Pharmacotherapy (current): Medications ordered:  Meds ordered this encounter  Medications  . diclofenac (VOLTAREN) 75 MG EC tablet    Sig: Take 1 tablet (75 mg total) by mouth 2 (two) times daily after  a meal.    Dispense:  60 tablet    Refill:  0  . gabapentin (NEURONTIN) 100 MG capsule    Sig: 100 mg qhs x 1 week, then 200 mg qhs for week 2, 300 mg qhs week 3    Dispense:  90 capsule    Refill:  0    Do not place this medication, or any other prescription from our practice, on "Automatic Refill". Patient may have prescription filled one day early if pharmacy is closed on scheduled refill date.  Marland Kitchen omeprazole (PRILOSEC) 20 MG capsule    Sig: Take 1 capsule (20 mg total) by mouth daily.    Dispense:  30 capsule    Refill:  1   Medications administered during this visit: Bernese A. Jesus had no medications administered during this visit.   Pharmacological management options:  Opioid Analgesics: The patient was informed that there is no guarantee that she would be a candidate for opioid analgesics. The decision will be made following CDC guidelines. This decision will be based on the results of diagnostic studies, as well as Ms. Kolarik risk profile.   Membrane stabilizer: To be determined at a later time gabapentin, Lyrica, TCA  Muscle relaxant: To be determined at a later time tizanidine, Robaxin  NSAID: To be determined at a  later time diclofenac, Mobic, Celebrex  Other analgesic(s): To be determined at a later time   Interventional management options: Ms. Sacks was informed that there is no guarantee that she would be a candidate for interventional therapies. The decision will be based on the results of diagnostic studies, as well as Ms. Abbey risk profile.  Procedure(s) under consideration:  -Right L4-L5 ESI -Bilateral L3, L4, L5 lumbar facets -Bilateral SI joint injections -Right intra-articular hip joint injection   Provider-requested follow-up: Return in about 6 days (around 01/16/2018) for Procedure.  Future Appointments  Date Time Provider [place]  01/16/2018  9:45 AM Gillis Santa, MD [facility] None    Primary Care Physician: Adin Hector, MD Location: [facility] Note by: Gillis Santa, M.D, Date: 01/10/2018; Time: 11:52 AM  Patient Instructions   Voltaren, gabapentin and prilosec have been escribed to your pharmacy. You will follow through with urine lab.    GENERAL RISKS AND COMPLICATIONS  What are the risk, side effects and possible complications? Generally speaking, most procedures are safe.  However, with any procedure there are risks, side effects, and the possibility of complications.  The risks and complications are dependent upon the sites that are lesioned, or the type of nerve block to be performed.  The closer the procedure is to the spine, the more serious the risks are.  Great care is taken when placing the radio frequency needles, block needles or lesioning probes, but sometimes complications can occur. 1. Infection: Any time there is an injection through the skin, there is a risk of infection.  This is why sterile conditions are used for these blocks.  There are four possible types of infection. 1. Localized skin infection. 2. Central Nervous System Infection-This can be in the form of Meningitis, which can be deadly. 3. Epidural  Infections-This can be in the form of an epidural abscess, which can cause pressure inside of the spine, causing compression of the spinal cord with subsequent paralysis. This would require an emergency surgery to decompress, and there are no guarantees that the patient would recover from the paralysis. 4. Discitis-This is an infection of the intervertebral discs.  It occurs in about 1% of discography procedures.  It is difficult to treat and it may lead to surgery.        2. Pain: the needles have to go through skin and soft tissues, will cause soreness.       3. Damage to internal structures:  The nerves to be lesioned may be near blood vessels or    other nerves which can be potentially damaged.       4. Bleeding: Bleeding is more common if the patient is taking blood thinners such as  aspirin, Coumadin, Ticiid, Plavix, etc., or if he/she have some genetic predisposition  such as hemophilia. Bleeding into the spinal canal can cause compression of the spinal  cord with subsequent paralysis.  This would require an emergency surgery to  decompress and there are no guarantees that the patient would recover from the  paralysis.       5. Pneumothorax:  Puncturing of a lung is a possibility, every time a needle is introduced in  the area of the chest or upper back.  Pneumothorax refers to free air around the  collapsed lung(s), inside of the thoracic cavity (chest cavity).  Another two possible  complications related to a similar event would include: Hemothorax and Chylothorax.   These are variations of the Pneumothorax, where instead of air around the collapsed  lung(s), you may have blood or chyle, respectively.       6. Spinal headaches: They may occur with any procedures in the area of the spine.       7. Persistent CSF (Cerebro-Spinal Fluid) leakage: This is a rare problem, but may occur  with prolonged intrathecal or epidural catheters either due to the formation of a fistulous  track or a dural tear.        8. Nerve damage: By working so close to the spinal cord, there is always a possibility of  nerve damage, which could be as serious as a permanent spinal cord injury with  paralysis.       9. Death:  Although rare, severe deadly allergic reactions known as "Anaphylactic  reaction" can occur to any of the medications used.      10. Worsening of the symptoms:  We can always make thing worse.  What are the chances of something like this happening? Chances of any of this occuring are extremely low.  By statistics, you have more of a chance of getting killed in a motor vehicle accident: while driving to the hospital than any of the above occurring .  Nevertheless, you should be aware that they are possibilities.  In general, it is similar to taking a shower.  Everybody knows that you can slip, hit your head and get killed.  Does that mean that you should not shower again?  Nevertheless always keep in mind that statistics do not mean anything if you happen to be on the wrong side of them.  Even if a procedure has a 1 (one) in a 1,000,000 (million) chance of going wrong, it you happen to be that one..Also, keep in mind that by statistics, you have more of a chance of having something go wrong when taking medications.  Who should not have this procedure? If you are on a blood thinning medication (e.g. Coumadin, Plavix, see list of "Blood Thinners"), or if you have an active infection going on, you should not have the procedure.  If you are taking any blood thinners, please inform your physician.  How should I prepare for this procedure?  Do not eat or drink anything at least six hours prior to the procedure.  Bring a driver with you .  It cannot be a taxi.  Come accompanied by an adult that can drive you back, and that is strong enough to help you if your legs get weak or numb from the local anesthetic.  Take all of your medicines the morning of the procedure with just enough water to swallow them.  If  you have diabetes, make sure that you are scheduled to have your procedure done first thing in the morning, whenever possible.  If you have diabetes, take only half of your insulin dose and notify our nurse that you have done so as soon as you arrive at the clinic.  If you are diabetic, but only take blood sugar pills (oral hypoglycemic), then do not take them on the morning of your procedure.  You may take them after you have had the procedure.  Do not take aspirin or any aspirin-containing medications, at least eleven (11) days prior to the procedure.  They may prolong bleeding.  Wear loose fitting clothing that may be easy to take off and that you would not mind if it got stained with Betadine or blood.  Do not wear any jewelry or perfume  Remove any nail coloring.  It will interfere with some of our monitoring equipment.  NOTE: Remember that this is not meant to be interpreted as a complete list of all possible complications.  Unforeseen problems may occur.  BLOOD THINNERS The following drugs contain aspirin or other products, which can cause increased bleeding during surgery and should not be taken for 2 weeks prior to and 1 week after surgery.  If you should need take something for relief of minor pain, you may take acetaminophen which is found in Tylenol,m Datril, Anacin-3 and Panadol. It is not blood thinner. The products listed below are.  Do not take any of the products listed below in addition to any listed on your instruction sheet.  A.P.C or A.P.C with Codeine Codeine Phosphate Capsules #3 Ibuprofen Ridaura  ABC compound Congesprin Imuran rimadil  Advil Cope Indocin Robaxisal  Alka-Seltzer Effervescent Pain Reliever and Antacid Coricidin or Coricidin-D  Indomethacin Rufen  Alka-Seltzer plus Cold Medicine Cosprin Ketoprofen S-A-C Tablets  Anacin Analgesic Tablets or Capsules Coumadin Korlgesic Salflex  Anacin Extra Strength Analgesic tablets or capsules CP-2 Tablets Lanoril  Salicylate  Anaprox Cuprimine Capsules Levenox Salocol  Anexsia-D Dalteparin Magan Salsalate  Anodynos Darvon compound Magnesium Salicylate Sine-off  Ansaid Dasin Capsules Magsal Sodium Salicylate  Anturane Depen Capsules Marnal Soma  APF Arthritis pain formula Dewitt's Pills Measurin Stanback  Argesic Dia-Gesic Meclofenamic Sulfinpyrazone  Arthritis Bayer Timed Release Aspirin Diclofenac Meclomen Sulindac  Arthritis pain formula Anacin Dicumarol Medipren Supac  Analgesic (Safety coated) Arthralgen Diffunasal Mefanamic Suprofen  Arthritis Strength Bufferin Dihydrocodeine Mepro Compound Suprol  Arthropan liquid Dopirydamole Methcarbomol with Aspirin Synalgos  ASA tablets/Enseals Disalcid Micrainin Tagament  Ascriptin Doan's Midol Talwin  Ascriptin A/D Dolene Mobidin Tanderil  Ascriptin Extra Strength Dolobid Moblgesic Ticlid  Ascriptin with Codeine Doloprin or Doloprin with Codeine Momentum Tolectin  Asperbuf Duoprin Mono-gesic Trendar  Aspergum Duradyne Motrin or Motrin IB Triminicin  Aspirin plain, buffered or enteric coated Durasal Myochrisine Trigesic  Aspirin Suppositories Easprin Nalfon Trillsate  Aspirin with Codeine Ecotrin Regular or Extra Strength Naprosyn Uracel  Atromid-S Efficin Naproxen Ursinus  Auranofin Capsules Elmiron Neocylate Vanquish  Axotal Emagrin Norgesic Verin  Azathioprine  Empirin or Empirin with Codeine Normiflo Vitamin E  Azolid Emprazil Nuprin Voltaren  Bayer Aspirin plain, buffered or children's or timed BC Tablets or powders Encaprin Orgaran Warfarin Sodium  Buff-a-Comp Enoxaparin Orudis Zorpin  Buff-a-Comp with Codeine Equegesic Os-Cal-Gesic   Buffaprin Excedrin plain, buffered or Extra Strength Oxalid   Bufferin Arthritis Strength Feldene Oxphenbutazone   Bufferin plain or Extra Strength Feldene Capsules Oxycodone with Aspirin   Bufferin with Codeine Fenoprofen Fenoprofen Pabalate or Pabalate-SF   Buffets II Flogesic Panagesic   Buffinol plain or  Extra Strength Florinal or Florinal with Codeine Panwarfarin   Buf-Tabs Flurbiprofen Penicillamine   Butalbital Compound Four-way cold tablets Penicillin   Butazolidin Fragmin Pepto-Bismol   Carbenicillin Geminisyn Percodan   Carna Arthritis Reliever Geopen Persantine   Carprofen Gold's salt Persistin   Chloramphenicol Goody's Phenylbutazone   Chloromycetin Haltrain Piroxlcam   Clmetidine heparin Plaquenil   Cllnoril Hyco-pap Ponstel   Clofibrate Hydroxy chloroquine Propoxyphen         Before stopping any of these medications, be sure to consult the physician who ordered them.  Some, such as Coumadin (Warfarin) are ordered to prevent or treat serious conditions such as "deep thrombosis", "pumonary embolisms", and other heart problems.  The amount of time that you may need off of the medication may also vary with the medication and the reason for which you were taking it.  If you are taking any of these medications, please make sure you notify your pain physician before you undergo any procedures.         Epidural Steroid Injection Patient Information  Description: The epidural space surrounds the nerves as they exit the spinal cord.  In some patients, the nerves can be compressed and inflamed by a bulging disc or a tight spinal canal (spinal stenosis).  By injecting steroids into the epidural space, we can bring irritated nerves into direct contact with a potentially helpful medication.  These steroids act directly on the irritated nerves and can reduce swelling and inflammation which often leads to decreased pain.  Epidural steroids may be injected anywhere along the spine and from the neck to the low back depending upon the location of your pain.   After numbing the skin with local anesthetic (like Novocaine), a small needle is passed into the epidural space slowly.  You may experience a sensation of pressure while this is being done.  The entire block usually last less than 10  minutes.  Conditions which may be treated by epidural steroids:   Low back and leg pain  Neck and arm pain  Spinal stenosis  Post-laminectomy syndrome  Herpes zoster (shingles) pain  Pain from compression fractures  Preparation for the injection:  1. Do not eat any solid food or dairy products within 8 hours of your appointment.  2. You may drink clear liquids up to 3 hours before appointment.  Clear liquids include water, black coffee, juice or soda.  No milk or cream please. 3. You may take your regular medication, including pain medications, with a sip of water before your appointment  Diabetics should hold regular insulin (if taken separately) and take 1/2 normal NPH dos the morning of the procedure.  Carry some sugar containing items with you to your appointment. 4. A driver must accompany you and be prepared to drive you home after your procedure.  5. Bring all your current medications with your. 6. An IV may be inserted and sedation may be given at the discretion of the physician.  7. A blood pressure cuff, EKG and other monitors will often be applied during the procedure.  Some patients may need to have extra oxygen administered for a short period. 8. You will be asked to provide medical information, including your allergies, prior to the procedure.  We must know immediately if you are taking blood thinners (like Coumadin/Warfarin)  Or if you are allergic to IV iodine contrast (dye). We must know if you could possible be pregnant.  Possible side-effects:  Bleeding from needle site  Infection (rare, may require surgery)  Nerve injury (rare)  Numbness & tingling (temporary)  Difficulty urinating (rare, temporary)  Spinal headache ( a headache worse with upright posture)  Light -headedness (temporary)  Pain at injection site (several days)  Decreased blood pressure (temporary)  Weakness in arm/leg (temporary)  Pressure sensation in back/neck (temporary)  Call  if you experience:  Fever/chills associated with headache or increased back/neck pain.  Headache worsened by an upright position.  New onset weakness or numbness of an extremity below the injection site  Hives or difficulty breathing (go to the emergency room)  Inflammation or drainage at the infection site  Severe back/neck pain  Any new symptoms which are concerning to you  Please note:  Although the local anesthetic injected can often make your back or neck feel good for several hours after the injection, the pain will likely return.  It takes 3-7 days for steroids to work in the epidural space.  You may not notice any pain relief for at least that one week.  If effective, we will often do a series of three injections spaced 3-6 weeks apart to maximally decrease your pain.  After the initial series, we generally will wait several months before considering a repeat injection of the same type.  If you have any questions, please call [phone] [facility]

## 2018-01-10 NOTE — Progress Notes (Signed)
Safety precautions to be maintained throughout the outpatient stay will include: orient to surroundings, keep bed in low position, maintain call bell within reach at all times, provide assistance with transfer out of bed and ambulation.  

## 2018-01-10 NOTE — Patient Instructions (Addendum)
Voltaren, gabapentin and prilosec have been escribed to your pharmacy. You will follow through with urine lab.    GENERAL RISKS AND COMPLICATIONS  What are the risk, side effects and possible complications? Generally speaking, most procedures are safe.  However, with any procedure there are risks, side effects, and the possibility of complications.  The risks and complications are dependent upon the sites that are lesioned, or the type of nerve block to be performed.  The closer the procedure is to the spine, the more serious the risks are.  Great care is taken when placing the radio frequency needles, block needles or lesioning probes, but sometimes complications can occur. 1. Infection: Any time there is an injection through the skin, there is a risk of infection.  This is why sterile conditions are used for these blocks.  There are four possible types of infection. 1. Localized skin infection. 2. Central Nervous System Infection-This can be in the form of Meningitis, which can be deadly. 3. Epidural Infections-This can be in the form of an epidural abscess, which can cause pressure inside of the spine, causing compression of the spinal cord with subsequent paralysis. This would require an emergency surgery to decompress, and there are no guarantees that the patient would recover from the paralysis. 4. Discitis-This is an infection of the intervertebral discs.  It occurs in about 1% of discography procedures.  It is difficult to treat and it may lead to surgery.        2. Pain: the needles have to go through skin and soft tissues, will cause soreness.       3. Damage to internal structures:  The nerves to be lesioned may be near blood vessels or    other nerves which can be potentially damaged.       4. Bleeding: Bleeding is more common if the patient is taking blood thinners such as  aspirin, Coumadin, Ticiid, Plavix, etc., or if he/she have some genetic predisposition  such as hemophilia.  Bleeding into the spinal canal can cause compression of the spinal  cord with subsequent paralysis.  This would require an emergency surgery to  decompress and there are no guarantees that the patient would recover from the  paralysis.       5. Pneumothorax:  Puncturing of a lung is a possibility, every time a needle is introduced in  the area of the chest or upper back.  Pneumothorax refers to free air around the  collapsed lung(s), inside of the thoracic cavity (chest cavity).  Another two possible  complications related to a similar event would include: Hemothorax and Chylothorax.   These are variations of the Pneumothorax, where instead of air around the collapsed  lung(s), you may have blood or chyle, respectively.       6. Spinal headaches: They may occur with any procedures in the area of the spine.       7. Persistent CSF (Cerebro-Spinal Fluid) leakage: This is a rare problem, but may occur  with prolonged intrathecal or epidural catheters either due to the formation of a fistulous  track or a dural tear.       8. Nerve damage: By working so close to the spinal cord, there is always a possibility of  nerve damage, which could be as serious as a permanent spinal cord injury with  paralysis.       9. Death:  Although rare, severe deadly allergic reactions known as "Anaphylactic  reaction" can occur to any of  the medications used.      10. Worsening of the symptoms:  We can always make thing worse.  What are the chances of something like this happening? Chances of any of this occuring are extremely low.  By statistics, you have more of a chance of getting killed in a motor vehicle accident: while driving to the hospital than any of the above occurring .  Nevertheless, you should be aware that they are possibilities.  In general, it is similar to taking a shower.  Everybody knows that you can slip, hit your head and get killed.  Does that mean that you should not shower again?  Nevertheless always keep  in mind that statistics do not mean anything if you happen to be on the wrong side of them.  Even if a procedure has a 1 (one) in a 1,000,000 (million) chance of going wrong, it you happen to be that one..Also, keep in mind that by statistics, you have more of a chance of having something go wrong when taking medications.  Who should not have this procedure? If you are on a blood thinning medication (e.g. Coumadin, Plavix, see list of "Blood Thinners"), or if you have an active infection going on, you should not have the procedure.  If you are taking any blood thinners, please inform your physician.  How should I prepare for this procedure?  Do not eat or drink anything at least six hours prior to the procedure.  Bring a driver with you .  It cannot be a taxi.  Come accompanied by an adult that can drive you back, and that is strong enough to help you if your legs get weak or numb from the local anesthetic.  Take all of your medicines the morning of the procedure with just enough water to swallow them.  If you have diabetes, make sure that you are scheduled to have your procedure done first thing in the morning, whenever possible.  If you have diabetes, take only half of your insulin dose and notify our nurse that you have done so as soon as you arrive at the clinic.  If you are diabetic, but only take blood sugar pills (oral hypoglycemic), then do not take them on the morning of your procedure.  You may take them after you have had the procedure.  Do not take aspirin or any aspirin-containing medications, at least eleven (11) days prior to the procedure.  They may prolong bleeding.  Wear loose fitting clothing that may be easy to take off and that you would not mind if it got stained with Betadine or blood.  Do not wear any jewelry or perfume  Remove any nail coloring.  It will interfere with some of our monitoring equipment.  NOTE: Remember that this is not meant to be interpreted as a  complete list of all possible complications.  Unforeseen problems may occur.  BLOOD THINNERS The following drugs contain aspirin or other products, which can cause increased bleeding during surgery and should not be taken for 2 weeks prior to and 1 week after surgery.  If you should need take something for relief of minor pain, you may take acetaminophen which is found in Tylenol,m Datril, Anacin-3 and Panadol. It is not blood thinner. The products listed below are.  Do not take any of the products listed below in addition to any listed on your instruction sheet.  A.P.C or A.P.C with Codeine Codeine Phosphate Capsules #3 Ibuprofen Ridaura  ABC compound Congesprin  Imuran rimadil  Advil Cope Indocin Robaxisal  Alka-Seltzer Effervescent Pain Reliever and Antacid Coricidin or Coricidin-D  Indomethacin Rufen  Alka-Seltzer plus Cold Medicine Cosprin Ketoprofen S-A-C Tablets  Anacin Analgesic Tablets or Capsules Coumadin Korlgesic Salflex  Anacin Extra Strength Analgesic tablets or capsules CP-2 Tablets Lanoril Salicylate  Anaprox Cuprimine Capsules Levenox Salocol  Anexsia-D Dalteparin Magan Salsalate  Anodynos Darvon compound Magnesium Salicylate Sine-off  Ansaid Dasin Capsules Magsal Sodium Salicylate  Anturane Depen Capsules Marnal Soma  APF Arthritis pain formula Dewitt's Pills Measurin Stanback  Argesic Dia-Gesic Meclofenamic Sulfinpyrazone  Arthritis Bayer Timed Release Aspirin Diclofenac Meclomen Sulindac  Arthritis pain formula Anacin Dicumarol Medipren Supac  Analgesic (Safety coated) Arthralgen Diffunasal Mefanamic Suprofen  Arthritis Strength Bufferin Dihydrocodeine Mepro Compound Suprol  Arthropan liquid Dopirydamole Methcarbomol with Aspirin Synalgos  ASA tablets/Enseals Disalcid Micrainin Tagament  Ascriptin Doan's Midol Talwin  Ascriptin A/D Dolene Mobidin Tanderil  Ascriptin Extra Strength Dolobid Moblgesic Ticlid  Ascriptin with Codeine Doloprin or Doloprin with Codeine  Momentum Tolectin  Asperbuf Duoprin Mono-gesic Trendar  Aspergum Duradyne Motrin or Motrin IB Triminicin  Aspirin plain, buffered or enteric coated Durasal Myochrisine Trigesic  Aspirin Suppositories Easprin Nalfon Trillsate  Aspirin with Codeine Ecotrin Regular or Extra Strength Naprosyn Uracel  Atromid-S Efficin Naproxen Ursinus  Auranofin Capsules Elmiron Neocylate Vanquish  Axotal Emagrin Norgesic Verin  Azathioprine Empirin or Empirin with Codeine Normiflo Vitamin E  Azolid Emprazil Nuprin Voltaren  Bayer Aspirin plain, buffered or children's or timed BC Tablets or powders Encaprin Orgaran Warfarin Sodium  Buff-a-Comp Enoxaparin Orudis Zorpin  Buff-a-Comp with Codeine Equegesic Os-Cal-Gesic   Buffaprin Excedrin plain, buffered or Extra Strength Oxalid   Bufferin Arthritis Strength Feldene Oxphenbutazone   Bufferin plain or Extra Strength Feldene Capsules Oxycodone with Aspirin   Bufferin with Codeine Fenoprofen Fenoprofen Pabalate or Pabalate-SF   Buffets II Flogesic Panagesic   Buffinol plain or Extra Strength Florinal or Florinal with Codeine Panwarfarin   Buf-Tabs Flurbiprofen Penicillamine   Butalbital Compound Four-way cold tablets Penicillin   Butazolidin Fragmin Pepto-Bismol   Carbenicillin Geminisyn Percodan   Carna Arthritis Reliever Geopen Persantine   Carprofen Gold's salt Persistin   Chloramphenicol Goody's Phenylbutazone   Chloromycetin Haltrain Piroxlcam   Clmetidine heparin Plaquenil   Cllnoril Hyco-pap Ponstel   Clofibrate Hydroxy chloroquine Propoxyphen         Before stopping any of these medications, be sure to consult the physician who ordered them.  Some, such as Coumadin (Warfarin) are ordered to prevent or treat serious conditions such as "deep thrombosis", "pumonary embolisms", and other heart problems.  The amount of time that you may need off of the medication may also vary with the medication and the reason for which you were taking it.  If you are  taking any of these medications, please make sure you notify your pain physician before you undergo any procedures.         Epidural Steroid Injection Patient Information  Description: The epidural space surrounds the nerves as they exit the spinal cord.  In some patients, the nerves can be compressed and inflamed by a bulging disc or a tight spinal canal (spinal stenosis).  By injecting steroids into the epidural space, we can bring irritated nerves into direct contact with a potentially helpful medication.  These steroids act directly on the irritated nerves and can reduce swelling and inflammation which often leads to decreased pain.  Epidural steroids may be injected anywhere along the spine and from the neck to the low back depending upon  the location of your pain.   After numbing the skin with local anesthetic (like Novocaine), a small needle is passed into the epidural space slowly.  You may experience a sensation of pressure while this is being done.  The entire block usually last less than 10 minutes.  Conditions which may be treated by epidural steroids:   Low back and leg pain  Neck and arm pain  Spinal stenosis  Post-laminectomy syndrome  Herpes zoster (shingles) pain  Pain from compression fractures  Preparation for the injection:  1. Do not eat any solid food or dairy products within 8 hours of your appointment.  2. You may drink clear liquids up to 3 hours before appointment.  Clear liquids include water, black coffee, juice or soda.  No milk or cream please. 3. You may take your regular medication, including pain medications, with a sip of water before your appointment  Diabetics should hold regular insulin (if taken separately) and take 1/2 normal NPH dos the morning of the procedure.  Carry some sugar containing items with you to your appointment. 4. A driver must accompany you and be prepared to drive you home after your procedure.  5. Bring all your current  medications with your. 6. An IV may be inserted and sedation may be given at the discretion of the physician.   7. A blood pressure cuff, EKG and other monitors will often be applied during the procedure.  Some patients may need to have extra oxygen administered for a short period. 8. You will be asked to provide medical information, including your allergies, prior to the procedure.  We must know immediately if you are taking blood thinners (like Coumadin/Warfarin)  Or if you are allergic to IV iodine contrast (dye). We must know if you could possible be pregnant.  Possible side-effects:  Bleeding from needle site  Infection (rare, may require surgery)  Nerve injury (rare)  Numbness & tingling (temporary)  Difficulty urinating (rare, temporary)  Spinal headache ( a headache worse with upright posture)  Light -headedness (temporary)  Pain at injection site (several days)  Decreased blood pressure (temporary)  Weakness in arm/leg (temporary)  Pressure sensation in back/neck (temporary)  Call if you experience:  Fever/chills associated with headache or increased back/neck pain.  Headache worsened by an upright position.  New onset weakness or numbness of an extremity below the injection site  Hives or difficulty breathing (go to the emergency room)  Inflammation or drainage at the infection site  Severe back/neck pain  Any new symptoms which are concerning to you  Please note:  Although the local anesthetic injected can often make your back or neck feel good for several hours after the injection, the pain will likely return.  It takes 3-7 days for steroids to work in the epidural space.  You may not notice any pain relief for at least that one week.  If effective, we will often do a series of three injections spaced 3-6 weeks apart to maximally decrease your pain.  After the initial series, we generally will wait several months before considering a repeat injection of  the same type.  If you have any questions, please call [phone] [facility]

## 2018-01-14 ENCOUNTER — Telehealth: Payer: Self-pay | Admitting: *Deleted

## 2018-01-14 LAB — COMPLIANCE DRUG ANALYSIS, UR

## 2018-01-16 ENCOUNTER — Ambulatory Visit (HOSPITAL_BASED_OUTPATIENT_CLINIC_OR_DEPARTMENT_OTHER): Payer: Medicare HMO | Admitting: Student in an Organized Health Care Education/Training Program

## 2018-01-16 ENCOUNTER — Ambulatory Visit
Admission: RE | Admit: 2018-01-16 | Discharge: 2018-01-16 | Disposition: A | Discharge status: Home / Self Care | Payer: Medicare HMO | Source: Ambulatory Visit | Attending: Student in an Organized Health Care Education/Training Program | Admitting: Student in an Organized Health Care Education/Training Program

## 2018-01-16 ENCOUNTER — Other Ambulatory Visit: Payer: Self-pay

## 2018-01-16 ENCOUNTER — Encounter: Payer: Self-pay | Admitting: Student in an Organized Health Care Education/Training Program

## 2018-01-16 VITALS — BP 136/89 | HR 79 | Temp 98.0°F | Resp 14 | Ht 60.0 in | Wt 150.0 lb

## 2018-01-16 DIAGNOSIS — Z7952 Long term (current) use of systemic steroids: Secondary | ICD-10-CM | POA: Insufficient documentation

## 2018-01-16 DIAGNOSIS — Z888 Allergy status to other drugs, medicaments and biological substances status: Secondary | ICD-10-CM | POA: Diagnosis not present

## 2018-01-16 DIAGNOSIS — Z79899 Other long term (current) drug therapy: Secondary | ICD-10-CM | POA: Insufficient documentation

## 2018-01-16 DIAGNOSIS — M5416 Radiculopathy, lumbar region: Secondary | ICD-10-CM | POA: Diagnosis present

## 2018-01-16 DIAGNOSIS — M545 Low back pain: Secondary | ICD-10-CM | POA: Diagnosis present

## 2018-01-16 MED ORDER — DEXAMETHASONE SODIUM PHOSPHATE 10 MG/ML IJ SOLN
10.0000 mg | Freq: Once | INTRAMUSCULAR | Status: AC
  Administered 2018-01-16: 10 mg
  Filled 2018-01-16: qty 1

## 2018-01-16 MED ORDER — LIDOCAINE HCL (PF) 1 % IJ SOLN
4.5000 mL | Freq: Once | INTRAMUSCULAR | Status: AC
  Administered 2018-01-16: 5 mL
  Filled 2018-01-16: qty 5

## 2018-01-16 MED ORDER — IOPAMIDOL (ISOVUE-M 200) INJECTION 41%
10.0000 mL | Freq: Once | INTRAMUSCULAR | Status: AC
  Administered 2018-01-16: 10 mL via EPIDURAL
  Filled 2018-01-16: qty 10

## 2018-01-16 MED ORDER — ROPIVACAINE HCL 2 MG/ML IJ SOLN
2.0000 mL | Freq: Once | INTRAMUSCULAR | Status: AC
  Administered 2018-01-16: 10 mL via EPIDURAL
  Filled 2018-01-16: qty 10

## 2018-01-16 MED ORDER — SODIUM CHLORIDE 0.9% FLUSH
2.0000 mL | Freq: Once | INTRAVENOUS | Status: AC
  Administered 2018-01-16: 10 mL

## 2018-01-16 NOTE — Patient Instructions (Addendum)
To Whom It may Concern,  Rebekah KidneyDebra is a patient under my care who is getting treatment for her low back pain and right hip pain. Given her current health status, I recommend she limit her work days to no more than 6.5 hrs. Should you have any questions, please give us a call.   ____________________________________________________________________________________________  Post-procedure Information What to expect: Most procedures involve the use of a local anesthetic (numbing medicine), and a steroid (anti-inflammatory medicine).  The local anesthetics may cause temporary numbness and weakness of the legs or arms, depending on the location of the block. This numbness/weakness may last 4-6 hours, depending on the local anesthetic used. In rare instances, it can last up to 24 hours. While numb, you must be very careful not to injure the extremity.  After any procedure, you could expect the pain to get better within 15-20 minutes. This relief is temporary and may last 4-6 hours. Once the local anesthetics wears off, you could experience discomfort, possibly more than usual, for up to 10 (ten) days. In the case of radiofrequencies, it may last up to 6 weeks. Surgeries may take up to 8 weeks for the healing process. The discomfort is due to the irritation caused by needles going through skin and muscle. To minimize the discomfort, we recommend using ice the first day, and heat from then on. The ice should be applied for 15 minutes on, and 15 minutes off. Keep repeating this cycle until bedtime. Avoid applying the ice directly to the skin, to prevent frostbite. Heat should be used daily, until the pain improves (4-10 days). Be careful not to burn yourself.  Occasionally you may experience muscle spasms or cramps. These occur as a consequence of the irritation caused by the needle sticks to the muscle and the blood that will inevitably be lost into the surrounding muscle tissue. Blood tends to be very irritating  to tissues, which tend to react by going into spasm. These spasms may start the same day of your procedure, but they may also take days to develop. This late onset type of spasm or cramp is usually caused by electrolyte imbalances triggered by the steroids, at the level of the [place]. Cramps and spasms tend to respond well to muscle relaxants, multivitamins (some are triggered by the procedure, but may have their origins in vitamin deficiencies), and "Gatorade", or any sports drinks that can replenish any electrolyte imbalances. (If you are a diabetic, ask your pharmacist to get you a sugar-free brand.) Warm showers or baths may also be helpful. Stretching exercises are highly recommended.  General Instructions:  Be alert for signs of possible infection: redness, swelling, heat, red streaks, elevated temperature, and/or fever. These typically appear 4 to 6 days after the procedure. Immediately notify your doctor if you experience unusual bleeding, difficulty breathing, or loss of bowel or bladder control. If you experience increased pain, do not increase your pain medicine intake, unless instructed by your pain physician.  Post-Procedure Care:  Be careful in moving about. Muscle spasms in the area of the injection may occur. Applying ice or heat to the area is often helpful. The incidence of spinal headaches after epidural injections ranges between 1.4% and 6%. If you develop a headache that does not seem to respond to conservative therapy, please let your physician know. This can be treated with an epidural blood patch.   Post-procedure numbness or redness is to be expected, however it should average 4 to 6 hours. If numbness and weakness of  your extremities begins to develop 4 to 6 hours after your procedure, and is felt to be progressing and worsening, immediately contact your physician.  Diet:  If you experience nausea, do not eat until this sensation goes away. If you had a "Stellate Ganglion Block"  for upper extremity "Reflex Sympathetic Dystrophy", do not eat or drink until your hoarseness goes away. In any case, always start with liquids first and if you tolerate them well, then slowly progress to more solid foods.  Activity:  For the first 4 to 6 hours after the procedure, use caution in moving about as you may experience numbness and/or weakness. Use caution in cooking, using household electrical appliances, and climbing steps. If you need to reach your Doctor call our office: [phone] (During business hours) or [phone] (After business hours).  Business Hours: Monday-Thursday 8:00 am - 4:00 PM    Fridays: Closed     In case of an emergency: In case of emergency, call 911 or go to the nearest emergency room and have the physician there call us.  Interpretation of Procedure Every nerve block has two components: a diagnostic component, and a treatment component. Unrealistic expectations are the most common causes of "perceived failure".  In a perfect world, a single nerve block should be able to completely and permanently eliminate the pain. Sadly, the world is not perfect.  Most pain management nerve blocks are performed using local anesthetics and steroids. Steroids are responsible for any long-term benefit that you may experience. Their purpose is to decrease any chronic swelling that may exist in the area. Steroids begin to work immediately after being injected. However, most patients will not experience any benefits until 5 to 10 days after the injection, when the swelling has come down to the point where they can tell a difference. Steroids will only help if there is swelling to be treated. As such, they can assist with the diagnosis. If effective, they suggest an inflammatory component to the pain, and if ineffective, they rule out inflammation as the main cause or component of the problem. If the problem is one of mechanical compression, you will get no benefit from those  steroids.   In the case of local anesthetics, they have a crucial role in the diagnosis of your condition. Most will begin to work within15 to 20 minutes after injection. The duration will depend on the type used (short- vs. Long-acting). It is of outmost importance that patients keep tract of their pain, after the procedure. To assist with this matter, a "Post-procedure Pain Diary" is provided. Make sure to complete it and to bring it back to your follow-up appointment.  As long as the patient keeps accurate, detailed records of their symptoms after every procedure, and returns to have those interpreted, every procedure will provide [place] with invaluable information. Even a block that does not provide the patient with any relief, will always provide [place] with information about the mechanism and the origin of the pain. The only time a nerve block can be considered a waste of time is when patients do not keep track of the results, or do not keep their post-procedure appointment.  Reporting the results back to your physician The Pain Score  Pain is a subjective complaint. It cannot be seen, touched, or measured. We depend entirely on the patient's report of the pain in order to assess your condition and treatment. To evaluate the pain, we use a pain scale, where "0" means "No Pain", and  a "10" is "the worst possible pain that you can even imagine" (i.e. something like been eaten alive by a shark or being torn apart by a lion).   Use the Pain Scale provided. You will frequently be asked to rate your pain. Please be accurate, remember that medical decisions will be based on your responses. Please do not rate your pain above a 10. Doing so is actually interpreted as "symptom magnification" (exaggeration). To put this into perspective, when you tell us that your pain is at a 10 (ten), what you are saying is that there is nothing we can do to make this pain any worse. (Carefully think about  that.) ____________________________________________________________________________________________

## 2018-01-16 NOTE — Progress Notes (Signed)
Patient's Name: Rebekah Shaw  MRN: [id number]  Referring Provider: Lynnea FerrierKlein, Bert J III, MD  DOB: 10/04/1950  PCP: Lynnea FerrierKlein, Bert J III, MD  DOS: 01/16/2018  Note by: Edward JollyBilal Mahmood Boehringer, MD  Service setting: Ambulatory outpatient  Specialty: Interventional Pain Management  Patient type: Established  Location: [facility] (AMB) Pain Management Facility  Visit type: Interventional Procedure   Primary Reason for Visit: Interventional Pain Management Treatment. CC: Back Pain (lower/mid)  Procedure:          Anesthesia, Analgesia, Anxiolysis:  Type: Diagnostic Inter-Laminar Epidural Steroid Injection #1  Region: Lumbar Level: L4-5 Level. Laterality: Right         Type: Local Anesthesia Indication(s): Analgesia         Route: Infiltration ([place]/IM) IV Access: Declined Sedation: Declined  Local Anesthetic: Lidocaine 1-2%   Indications: 1. Lumbar radiculopathy (Right L4/5)    Pain Score: Pre-procedure: 9 /10 Post-procedure: 5 /10  Pre-op Assessment:  Rebekah Shaw is a 67 y.o. (year old), female patient, seen today for interventional treatment. She  has a past surgical history that includes Tonsillectomy and adenoidectomy; Appendectomy; Foot surgery; and Vascular surgery (Bilateral, 2009). Rebekah Shaw has a current medication list which includes the following prescription(s): b complex vitamins, biotin, diclofenac sodium, fexofenadine, magnesium, multivitamin-iron-minerals-folic acid, fish oil, UNABLE TO FIND, cholecalciferol, diclofenac, gabapentin, ibuprofen, and omeprazole. Her primarily concern today is the Back Pain (lower/mid)  Initial Vital Signs:  Pulse/HCG Rate: 79ECG Heart Rate: 91 Temp: 98 F (36.7 C) Resp: 18 BP: (!) 151/77 SpO2: 100 %  BMI: Estimated body mass index is 29.29 kg/m as calculated from the following:   Height as of this encounter: 5' (1.524 m).   Weight as of this encounter: 150 lb (68 kg).  Risk Assessment: Allergies: Reviewed. She is allergic to prednisone; atorvastatin; ezetimibe;  ibandronic acid; pravastatin; rosuvastatin; simvastatin; and meloxicam.  Allergy Precautions: None required Coagulopathies: Reviewed. None identified.  Blood-thinner therapy: None at this time Active Infection(s): Reviewed. None identified. Rebekah Shaw is afebrile  Site Confirmation: Rebekah Shaw was asked to confirm the procedure and laterality before marking the site Procedure checklist: Completed Consent: Before the procedure and under the influence of no sedative(s), amnesic(s), or anxiolytics, the patient was informed of the treatment options, risks and possible complications. To fulfill our ethical and legal obligations, as recommended by the American Medical Association's Code of Ethics, I have informed the patient of my clinical impression; the nature and purpose of the treatment or procedure; the risks, benefits, and possible complications of the intervention; the alternatives, including doing nothing; the risk(s) and benefit(s) of the alternative treatment(s) or procedure(s); and the risk(s) and benefit(s) of doing nothing. The patient was provided information about the general risks and possible complications associated with the procedure. These may include, but are not limited to: failure to achieve desired goals, infection, bleeding, organ or nerve damage, allergic reactions, paralysis, and death. In addition, the patient was informed of those risks and complications associated to Spine-related procedures, such as failure to decrease pain; infection (i.e.: Meningitis, epidural or intraspinal abscess); bleeding (i.e.: epidural hematoma, subarachnoid hemorrhage, or any other type of intraspinal or peri-dural bleeding); organ or nerve damage (i.e.: Any type of peripheral nerve, nerve root, or spinal cord injury) with subsequent damage to sensory, motor, and/or autonomic systems, resulting in permanent pain, numbness, and/or weakness of one or several areas of the body; allergic reactions; (i.e.:  anaphylactic reaction); and/or death. Furthermore, the patient was informed of those risks and complications associated with the  medications. These include, but are not limited to: allergic reactions (i.e.: anaphylactic or anaphylactoid reaction(s)); adrenal axis suppression; blood sugar elevation that in diabetics may result in ketoacidosis or comma; water retention that in patients with history of congestive heart failure may result in shortness of breath, pulmonary edema, and decompensation with resultant heart failure; weight gain; swelling or edema; medication-induced neural toxicity; particulate matter embolism and blood vessel occlusion with resultant organ, and/or nervous system infarction; and/or aseptic necrosis of one or more joints. Finally, the patient was informed that Medicine is not an exact science; therefore, there is also the possibility of unforeseen or unpredictable risks and/or possible complications that may result in a catastrophic outcome. The patient indicated having understood very clearly. We have given the patient no guarantees and we have made no promises. Enough time was given to the patient to ask questions, all of which were answered to the patient's satisfaction. Ms. Mcilvain has indicated that she wanted to continue with the procedure. Attestation: I, the ordering provider, attest that I have discussed with the patient the benefits, risks, side-effects, alternatives, likelihood of achieving goals, and potential problems during recovery for the procedure that I have provided informed consent. Date  Time: 01/16/2018  9:28 AM  Pre-Procedure Preparation:  Monitoring: As per clinic protocol. Respiration, ETCO2, SpO2, BP, heart rate and rhythm monitor placed and checked for adequate function Safety Precautions: Patient was assessed for positional comfort and pressure points before starting the procedure. Time-out: I initiated and conducted the "Time-out" before starting the  procedure, as per protocol. The patient was asked to participate by confirming the accuracy of the "Time Out" information. Verification of the correct person, site, and procedure were performed and confirmed by me, the nursing staff, and the patient. "Time-out" conducted as per Joint Commission's Universal Protocol (UP.01.01.01). Time: 1031  Description of Procedure:          Position: Prone with head of the table was raised to facilitate breathing. Target Area: The interlaminar space, initially targeting the lower laminar border of the superior vertebral body. Approach: Paramedial approach. Area Prepped: Entire Posterior Lumbar Region Prepping solution: ChloraPrep (2% chlorhexidine gluconate and 70% isopropyl alcohol) Safety Precautions: Aspiration looking for blood return was conducted prior to all injections. At no point did we inject any substances, as a needle was being advanced. No attempts were made at seeking any paresthesias. Safe injection practices and needle disposal techniques used. Medications properly checked for expiration dates. SDV (single dose vial) medications used. Description of the Procedure: Protocol guidelines were followed. The procedure needle was introduced through the skin, ipsilateral to the reported pain, and advanced to the target area. Bone was contacted and the needle walked caudad, until the lamina was cleared. The epidural space was identified using "loss-of-resistance technique" with 2-3 ml of PF-NaCl (0.9% NSS), in a 5cc LOR glass syringe. Vitals:   01/16/18 1032 01/16/18 1033 01/16/18 1038 01/16/18 1045  BP: (!) 153/79 (!) 146/76 (!) 141/88 136/89  Pulse:      Resp: 18 17 11 14   Temp:      SpO2: 96% 95% 96% 98%  Weight:      Height:        Start Time: 1031 hrs. End Time: 1040 hrs. Materials:  Needle(s) Type: Epidural needle Gauge: 17G Length: 3.5-in Medication(s): Please see orders for medications and dosing details. 8 CC solution made of 5 cc of  preservative-free saline, 2 cc of 0.2% ropivacaine, 1 cc of Decadron 10 mg/cc Imaging Guidance (Spinal):  Type of Imaging Technique: Fluoroscopy Guidance (Spinal) Indication(s): Assistance in needle guidance and placement for procedures requiring needle placement in or near specific anatomical locations not easily accessible without such assistance. Exposure Time: Please see nurses notes. Contrast: Before injecting any contrast, we confirmed that the patient did not have an allergy to iodine, shellfish, or radiological contrast. Once satisfactory needle placement was completed at the desired level, radiological contrast was injected. Contrast injected under live fluoroscopy. No contrast complications. See chart for type and volume of contrast used. Fluoroscopic Guidance: I was personally present during the use of fluoroscopy. "Tunnel Vision Technique" used to obtain the best possible view of the target area. Parallax error corrected before commencing the procedure. "Direction-depth-direction" technique used to introduce the needle under continuous pulsed fluoroscopy. Once target was reached, antero-posterior, oblique, and lateral fluoroscopic projection used confirm needle placement in all planes. Images permanently stored in EMR. Interpretation: I personally interpreted the imaging intraoperatively. Adequate needle placement confirmed in multiple planes. Appropriate spread of contrast into desired area was observed. No evidence of afferent or efferent intravascular uptake. No intrathecal or subarachnoid spread observed. Permanent images saved into the patient's record.  Antibiotic Prophylaxis:   Anti-infectives (From admission, onward)   None     Indication(s): None identified  Post-operative Assessment:  Post-procedure Vital Signs:  Pulse/HCG Rate: 7983 Temp: 98 F (36.7 C) Resp: 14 BP: 136/89 SpO2: 98 %  EBL: None  Complications: No immediate post-treatment complications  observed by team, or reported by patient.  Note: The patient tolerated the entire procedure well. A repeat set of vitals were taken after the procedure and the patient was kept under observation following institutional policy, for this type of procedure. Post-procedural neurological assessment was performed, showing return to baseline, prior to discharge. The patient was provided with post-procedure discharge instructions, including a section on how to identify potential problems. Should any problems arise concerning this procedure, the patient was given instructions to immediately contact us, at any time, without hesitation. In any case, we plan to contact the patient by telephone for a follow-up status report regarding this interventional procedure.  Comments:  No additional relevant information. 5 out of 5 strength bilateral lower extremity: Plantar flexion, dorsiflexion, knee flexion, knee extension.  Plan of Care    Imaging Orders     DG C-Arm 1-60 Min-No Report Procedure Orders    No procedure(s) ordered today    Medications ordered for procedure: Meds ordered this encounter  Medications  . iopamidol (ISOVUE-M) 41 % intrathecal injection 10 mL  . ropivacaine (PF) 2 mg/mL (0.2%) (NAROPIN) injection 2 mL  . sodium chloride flush (NS) 0.9 % injection 2 mL  . dexamethasone (DECADRON) injection 10 mg  . lidocaine (PF) (XYLOCAINE) 1 % injection 4.5 mL   Medications administered: We administered iopamidol, ropivacaine (PF) 2 mg/mL (0.2%), sodium chloride flush, dexamethasone, and lidocaine (PF).  See the medical record for exact dosing, route, and time of administration.  New Prescriptions   No medications on file   Disposition: Discharge home  Discharge Date & Time: 01/16/2018; 1050 hrs.   Physician-requested Follow-up: Return in about 1 month (around 02/13/2018) for Post Procedure Evaluation, Medication Management.  Future Appointments  Date Time Provider Department Center   02/12/2018 10:45 AM Edward Jolly, MD [facility] None   Primary Care Physician: Lynnea Ferrier, MD Location: [facility] Note by: Edward Jolly, MD Date: 01/16/2018; Time: 12:56 PM  Disclaimer:  Medicine is not an exact science. The only guarantee  in medicine is that nothing is guaranteed. It is important to note that the decision to proceed with this intervention was based on the information collected from the patient. The Data and conclusions were drawn from the patient's questionnaire, the interview, and the physical examination. Because the information was provided in large part by the patient, it cannot be guaranteed that it has not been purposely or unconsciously manipulated. Every effort has been made to obtain as much relevant data as possible for this evaluation. It is important to note that the conclusions that lead to this procedure are derived in large part from the available data. Always take into account that the treatment will also be dependent on availability of resources and existing treatment guidelines, considered by other Pain Management Practitioners as being common knowledge and practice, at the time of the intervention. For Medico-Legal purposes, it is also important to point out that variation in procedural techniques and pharmacological choices are the acceptable norm. The indications, contraindications, technique, and results of the above procedure should only be interpreted and judged by a Board-Certified Interventional Pain Specialist with extensive familiarity and expertise in the same exact procedure and technique.

## 2018-01-17 ENCOUNTER — Telehealth: Payer: Self-pay | Admitting: *Deleted

## 2018-01-17 NOTE — Telephone Encounter (Signed)
No problems post procedure. 

## 2018-02-12 ENCOUNTER — Ambulatory Visit
Payer: Medicare HMO | Attending: Student in an Organized Health Care Education/Training Program | Admitting: Student in an Organized Health Care Education/Training Program

## 2018-02-12 ENCOUNTER — Encounter: Payer: Self-pay | Admitting: Student in an Organized Health Care Education/Training Program

## 2018-02-12 ENCOUNTER — Other Ambulatory Visit: Payer: Self-pay

## 2018-02-12 VITALS — BP 128/61 | HR 74 | Temp 98.2°F | Resp 18 | Ht 60.0 in | Wt 150.0 lb

## 2018-02-12 DIAGNOSIS — S32050S Wedge compression fracture of fifth lumbar vertebra, sequela: Secondary | ICD-10-CM | POA: Diagnosis not present

## 2018-02-12 DIAGNOSIS — Z888 Allergy status to other drugs, medicaments and biological substances status: Secondary | ICD-10-CM | POA: Diagnosis not present

## 2018-02-12 DIAGNOSIS — Z886 Allergy status to analgesic agent status: Secondary | ICD-10-CM | POA: Diagnosis not present

## 2018-02-12 DIAGNOSIS — M5136 Other intervertebral disc degeneration, lumbar region: Secondary | ICD-10-CM | POA: Diagnosis not present

## 2018-02-12 DIAGNOSIS — E785 Hyperlipidemia, unspecified: Secondary | ICD-10-CM | POA: Diagnosis not present

## 2018-02-12 DIAGNOSIS — M545 Low back pain: Secondary | ICD-10-CM | POA: Diagnosis present

## 2018-02-12 DIAGNOSIS — M47816 Spondylosis without myelopathy or radiculopathy, lumbar region: Secondary | ICD-10-CM

## 2018-02-12 DIAGNOSIS — G894 Chronic pain syndrome: Secondary | ICD-10-CM | POA: Insufficient documentation

## 2018-02-12 DIAGNOSIS — M810 Age-related osteoporosis without current pathological fracture: Secondary | ICD-10-CM | POA: Insufficient documentation

## 2018-02-12 DIAGNOSIS — S32059S Unspecified fracture of fifth lumbar vertebra, sequela: Secondary | ICD-10-CM | POA: Diagnosis not present

## 2018-02-12 DIAGNOSIS — M5416 Radiculopathy, lumbar region: Secondary | ICD-10-CM

## 2018-02-12 DIAGNOSIS — Z79899 Other long term (current) drug therapy: Secondary | ICD-10-CM | POA: Diagnosis not present

## 2018-02-12 DIAGNOSIS — X58XXXS Exposure to other specified factors, sequela: Secondary | ICD-10-CM | POA: Insufficient documentation

## 2018-02-12 DIAGNOSIS — M4726 Other spondylosis with radiculopathy, lumbar region: Secondary | ICD-10-CM | POA: Diagnosis not present

## 2018-02-12 DIAGNOSIS — M1288 Other specific arthropathies, not elsewhere classified, other specified site: Secondary | ICD-10-CM | POA: Diagnosis not present

## 2018-02-12 DIAGNOSIS — M5116 Intervertebral disc disorders with radiculopathy, lumbar region: Secondary | ICD-10-CM | POA: Diagnosis not present

## 2018-02-12 MED ORDER — DULOXETINE HCL 30 MG PO CPEP: ORAL_CAPSULE | ORAL | 3 refills | Status: DC

## 2018-02-12 MED ORDER — DICLOFENAC SODIUM 75 MG PO TBEC: 75.0000 mg | DELAYED_RELEASE_TABLET | Freq: Two times a day (BID) | ORAL | 1 refills | Status: DC

## 2018-02-12 NOTE — Progress Notes (Signed)
Patient's Name: Rebekah Shaw  MRN: [id number]  Referring Provider: Adin Hector, MD  DOB: [birth year]  PCP: Adin Hector, MD  DOS: 02/12/2018  Note by: Gillis Santa, MD  Service setting: Ambulatory outpatient  Specialty: Interventional Pain Management  Location: [facility] (AMB) Pain Management Facility    Patient type: Established   Primary Reason(s) for Visit: Encounter for post-procedure evaluation of chronic illness with mild to moderate exacerbation CC: Back Pain (low and left) and Leg Pain (left)  HPI  [name] is a 67 y.o. year old, female patient, who comes today for a post-procedure evaluation. She has Lumbar radiculopathy (Right L4/5); Lumbar spondylosis; Lumbar facet arthropathy; Lumbar degenerative disc disease; Compression fracture of L5 lumbar vertebra, sequela; and Chronic pain syndrome on their problem list. Her primarily concern today is the Back Pain (low and left) and Leg Pain (left)  Pain Assessment: Location: Lower, Left Back Radiating: left leg Onset: More than a month ago Duration: Chronic pain Quality: Aching, Constant, Stabbing, Throbbing Severity: 7 /10 (subjective, self-reported pain score)  Note: Reported level is compatible with observation.                         When using our objective Pain Scale, levels between 6 and 10/10 are said to belong in an emergency room, as it progressively worsens from a 6/10, described as severely limiting, requiring emergency care not usually available at an outpatient pain management facility. At a 6/10 level, communication becomes difficult and requires great effort. Assistance to reach the emergency department may be required. Facial flushing and profuse sweating along with potentially dangerous increases in heart rate and blood pressure will be evident. Effect on ADL: weight gain Timing: Constant Modifying factors: procedures BP: 128/61  HR: 74  [name] comes in today for post-procedure evaluation after the treatment done  on 01/17/2018.  Further details on both, my assessment(s), as well as the proposed treatment plan, please see below.  Post-Procedure Assessment  01/16/2018 Procedure: Right L4-L5 ESI Pre-procedure pain score:        /10 Post-procedure pain score: 0/10         Influential Factors: BMI: 29.29 kg/m Intra-procedural challenges: None observed.         Assessment challenges: None detected.              Reported side-effects: None.        Post-procedural adverse reactions or complications: None reported         Sedation: Please see nurses note. When no sedatives are used, the analgesic levels obtained are directly associated to the effectiveness of the local anesthetics. However, when sedation is provided, the level of analgesia obtained during the initial 1 hour following the intervention, is believed to be the result of a combination of factors. These factors may include, but are not limited to: 1. The effectiveness of the local anesthetics used. 2. The effects of the analgesic(s) and/or anxiolytic(s) used. 3. The degree of discomfort experienced by the patient at the time of the procedure. 4. The patients ability and reliability in recalling and recording the events. 5. The presence and influence of possible secondary gains and/or psychosocial factors. Reported result: Relief experienced during the 1st hour after the procedure: 50 % (Ultra-Short Term Relief)            Interpretative annotation: Clinically appropriate result. Analgesia during this period is likely to be Local Anesthetic and/or IV Sedative (Analgesic/Anxiolytic) related.  Effects of local anesthetic: The analgesic effects attained during this period are directly associated to the localized infiltration of local anesthetics and therefore cary significant diagnostic value as to the etiological location, or anatomical origin, of the pain. Expected duration of relief is directly dependent on the pharmacodynamics of the local  anesthetic used. Long-acting (4-6 hours) anesthetics used.  Reported result: Relief during the next 4 to 6 hour after the procedure: 50 % (Short-Term Relief)            Interpretative annotation: Clinically appropriate result. Analgesia during this period is likely to be Local Anesthetic-related.          Long-term benefit: Defined as the period of time past the expected duration of local anesthetics (1 hour for short-acting and 4-6 hours for long-acting). With the possible exception of prolonged sympathetic blockade from the local anesthetics, benefits during this period are typically attributed to, or associated with, other factors such as analgesic sensory neuropraxia, antiinflammatory effects, or beneficial biochemical changes provided by agents other than the local anesthetics.  Reported result: Extended relief following procedure: 70 % (Long-Term Relief)            Interpretative annotation: Clinically possible results. Good relief. No permanent benefit expected. Inflammation plays a part in the etiology to the pain.          Current benefits: Defined as reported results that persistent at this point in time.   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 Effective diagnostic intervention.          Interpretation: Results would suggest a successful diagnostic intervention.                  Plan:  Repeat treatment or therapy and compare extent and duration of benefits.                Laboratory Chemistry  Inflammation Markers (CRP: Acute Phase) (ESR: Chronic Phase) No results found for: CRP, ESRSEDRATE, LATICACIDVEN                       Rheumatology Markers No results found for: RF, ANA, LABURIC, URICUR, LYMEIGGIGMAB, LYMEABIGMQN, HLAB27                      Renal Function  Markers No results found for: BUN, CREATININE, BCR, GFRAA, GFRNONAA                           Hepatic Function Markers No results found for: AST, ALT, ALBUMIN, ALKPHOS, HCVAB, AMYLASE, LIPASE, AMMONIA                      Electrolytes No results found for: NA, K, CL, CALCIUM, MG, PHOS                      Neuropathy Markers No results found for: VITAMINB12, FOLATE, HGBA1C, HIV                      Bone Pathology Markers No results found for: [place], [id number], [id number], [id number], 25OHVITD1, 25OHVITD2, 25OHVITD3, TESTOFREE, TESTOSTERONE  Coagulation Parameters No results found for: INR, LABPROT, APTT, PLT, DDIMER                      Cardiovascular Markers No results found for: BNP, CKTOTAL, CKMB, TROPONINI, HGB, HCT                       CA Markers No results found for: CEA, CA125, LABCA2                      Note: Lab results reviewed.   Meds   Current Outpatient Medications:  .  b complex vitamins tablet, Take 1 tablet by mouth daily., Disp: , Rfl:  .  BIOTIN 5000 PO, Take by mouth., Disp: , Rfl:  .  diclofenac (VOLTAREN) 75 MG EC tablet, Take 1 tablet (75 mg total) by mouth 2 (two) times daily after a meal., Disp: 60 tablet, Rfl: 1 .  diclofenac sodium (VOLTAREN) 1 % GEL, Apply topically 4 (four) times daily., Disp: , Rfl:  .  fexofenadine (ALLEGRA) 180 MG tablet, Take 180 mg by mouth daily., Disp: , Rfl:  .  ibuprofen (ADVIL,MOTRIN) 800 MG tablet, Take 800 mg by mouth every 8 (eight) hours as needed., Disp: , Rfl:  .  Magnesium 400 MG CAPS, Take by mouth., Disp: , Rfl:  .  multivitamin-iron-minerals-folic acid (CENTRUM) chewable tablet, Chew 1 tablet by mouth daily., Disp: , Rfl:  .  Omega-3 Fatty Acids (FISH OIL) 1200 MG CAPS, Take by mouth., Disp: , Rfl:  .  omeprazole (PRILOSEC) 20 MG capsule, Take 1 capsule (20 mg total) by mouth daily., Disp: 30 capsule, Rfl: 1 .  UNABLE TO FIND, Take by mouth. folic acid/multivit-min/lutein (CENTRUM SILVER  ORAL), Disp: , Rfl:  .  VITAMIN D, CHOLECALCIFEROL, PO, Take by mouth., Disp: , Rfl:  .  DULoxetine (CYMBALTA) 30 MG capsule, 30 mg daily x 3 weeks then 60 mg daily, Disp: 60 capsule, Rfl: 3  ROS  Constitutional: Denies any fever or chills Gastrointestinal: No reported hemesis, hematochezia, vomiting, or acute GI distress Musculoskeletal: Denies any acute onset joint swelling, redness, loss of ROM, or weakness Neurological: No reported episodes of acute onset apraxia, aphasia, dysarthria, agnosia, amnesia, paralysis, loss of coordination, or loss of consciousness  Allergies  Ms. Gal is allergic to prednisone; atorvastatin; ezetimibe; ibandronic acid; pravastatin; rosuvastatin; simvastatin; and meloxicam.  PFSH  Drug: Ms. Meditz  reports that she does not use drugs. Alcohol:  reports that she does not drink alcohol. Tobacco:  reports that she has never smoked. She has never used smokeless tobacco. Medical:  has a past medical history of Allergy, Arthritis (2019), Hyperlipidemia, and Osteoporosis. Surgical: Ms. Posa  has a past surgical history that includes Tonsillectomy and adenoidectomy; Appendectomy; Foot surgery; and Vascular surgery (Bilateral, 2009). Family: family history includes Cancer in her father.  Constitutional Exam  General appearance: Well nourished, well developed, and well hydrated. In no apparent acute distress Vitals:   02/12/18 1055  BP: 128/61  Pulse: 74  Resp: 18  Temp: 98.2 F (36.8 C)  TempSrc: Oral  SpO2: 98%  Weight: 150 lb (68 kg)  Height: 5' (1.524 m)   BMI Assessment: Estimated body mass index is 29.29 kg/m as calculated from the following:   Height as of this encounter: 5' (1.524 m).   Weight as of this encounter: 150 lb (68 kg).  BMI interpretation table: BMI level Category Range association with higher incidence of chronic  pain  <18 kg/m2 Underweight   18.5-24.9 kg/m2 Ideal body weight   25-29.9 kg/m2 Overweight Increased incidence by 20%   30-34.9 kg/m2 Obese (Class I) Increased incidence by 68%  35-39.9 kg/m2 Severe obesity (Class II) Increased incidence by 136%  >40 kg/m2 Extreme obesity (Class III) Increased incidence by 254%   Patient's current BMI Ideal Body weight  Body mass index is 29.29 kg/m. Ideal body weight: 45.5 kg (100 lb 4.9 oz) Adjusted ideal body weight: 54.5 kg (120 lb 3 oz)   BMI Readings from Last 4 Encounters:  02/12/18 29.29 kg/m  01/16/18 29.29 kg/m  01/10/18 29.29 kg/m   Wt Readings from Last 4 Encounters:  02/12/18 150 lb (68 kg)  01/16/18 150 lb (68 kg)  01/10/18 150 lb (68 kg)  Psych/Mental status: Alert, oriented x 3 (person, place, & time)       Eyes: PERLA Respiratory: No evidence of acute respiratory distress  Cervical Spine Area Exam  Skin & Axial Inspection: No masses, redness, edema, swelling, or associated skin lesions Alignment: Symmetrical Functional ROM: Unrestricted ROM      Stability: No instability detected Muscle Tone/Strength: Functionally intact. No obvious neuro-muscular anomalies detected. Sensory (Neurological): Unimpaired Palpation: No palpable anomalies              Upper Extremity (UE) Exam    Side: Right upper extremity  Side: Left upper extremity  Skin & Extremity Inspection: Skin color, temperature, and hair growth are WNL. No peripheral edema or cyanosis. No masses, redness, swelling, asymmetry, or associated skin lesions. No contractures.  Skin & Extremity Inspection: Skin color, temperature, and hair growth are WNL. No peripheral edema or cyanosis. No masses, redness, swelling, asymmetry, or associated skin lesions. No contractures.  Functional ROM: Unrestricted ROM          Functional ROM: Unrestricted ROM          Muscle Tone/Strength: Functionally intact. No obvious neuro-muscular anomalies detected.  Muscle Tone/Strength: Functionally intact. No obvious neuro-muscular anomalies detected.  Sensory (Neurological): Unimpaired          Sensory  (Neurological): Unimpaired          Palpation: No palpable anomalies              Palpation: No palpable anomalies              Provocative Test(s):  Phalen's test: deferred Tinel's test: deferred Apley's scratch test (touch opposite shoulder):  Action 1 (Across chest): deferred Action 2 (Overhead): deferred Action 3 (LB reach): deferred   Provocative Test(s):  Phalen's test: deferred Tinel's test: deferred Apley's scratch test (touch opposite shoulder):  Action 1 (Across chest): deferred Action 2 (Overhead): deferred Action 3 (LB reach): deferred    Thoracic Spine Area Exam  Skin & Axial Inspection: No masses, redness, or swelling Alignment: Symmetrical Functional ROM: Unrestricted ROM Stability: No instability detected Muscle Tone/Strength: Functionally intact. No obvious neuro-muscular anomalies detected. Sensory (Neurological): Unimpaired Muscle strength & Tone: No palpable anomalies  Lumbar Spine Area Exam  Skin & Axial Inspection: No masses, redness, or swelling Alignment: Symmetrical Functional ROM: Decreased ROM affecting both sides Stability: No instability detected Muscle Tone/Strength: Functionally intact. No obvious neuro-muscular anomalies detected. Sensory (Neurological): Dermatomal pain pattern Palpation: No palpable anomalies       Provocative Tests: Hyperextension/rotation test: deferred today       Lumbar quadrant test (Kemp's test): (+) bilateral for foraminal stenosis Lateral bending test: (+) ipsilateral radicular pain, on the left. Positive for left-sided foraminal stenosis.  Patrick's Maneuver: deferred today                   FABER test: deferred today                   S-I anterior distraction/compression test: deferred today         S-I lateral compression test: deferred today         S-I Thigh-thrust test: deferred today         S-I Gaenslen's test: deferred today          Gait & Posture Assessment  Ambulation: Unassisted Gait: Relatively  normal for age and body habitus Posture: WNL   Lower Extremity Exam    Side: Right lower extremity  Side: Left lower extremity  Stability: No instability observed          Stability: No instability observed          Skin & Extremity Inspection: Skin color, temperature, and hair growth are WNL. No peripheral edema or cyanosis. No masses, redness, swelling, asymmetry, or associated skin lesions. No contractures.  Skin & Extremity Inspection: Skin color, temperature, and hair growth are WNL. No peripheral edema or cyanosis. No masses, redness, swelling, asymmetry, or associated skin lesions. No contractures.  Functional ROM: Unrestricted ROM                  Functional ROM: Unrestricted ROM                  Muscle Tone/Strength: Functionally intact. No obvious neuro-muscular anomalies detected.  Muscle Tone/Strength: Functionally intact. No obvious neuro-muscular anomalies detected.  Sensory (Neurological): Unimpaired  Sensory (Neurological): Unimpaired  Palpation: No palpable anomalies  Palpation: No palpable anomalies   Assessment  Primary Diagnosis & Pertinent Problem List: The primary encounter diagnosis was Lumbar radiculopathy . Diagnoses of Lumbar spondylosis, Lumbar facet arthropathy, Lumbar degenerative disc disease, Compression fracture of L5 lumbar vertebra, sequela, and Chronic pain syndrome were also pertinent to this visit.  Status Diagnosis  Responding Persistent Persistent 1. Lumbar radiculopathy    2. Lumbar spondylosis   3. Lumbar facet arthropathy   4. Lumbar degenerative disc disease   5. Compression fracture of L5 lumbar vertebra, sequela   6. Chronic pain syndrome     General Recommendations: The pain condition that the patient suffers from is best treated with a multidisciplinary approach that involves an increase in physical activity to prevent de-conditioning and worsening of the pain cycle, as well as psychological counseling (formal and/or informal) to address the  co-morbid psychological affects of pain. Treatment will often involve judicious use of pain medications and interventional procedures to decrease the pain, allowing the patient to participate in the physical activity that will ultimately produce long-lasting pain reductions. The goal of the multidisciplinary approach is to return the patient to a higher level of overall function and to restore their ability to perform activities of daily living.  67 year old female with a chief complaint of axial low back pain that originally radiated down her right leg status post right L4-L5 lumbar ESI performed on 01/16/2018.  Patient follows up today for postprocedural evaluation.  She has noted significant benefit in regards to her right radicular pain but now complains of left-sided radicular pain.  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  Patient states that the gabapentin even though she is taking only at night still makes her sedated and makes her feel  slow in the morning.  Patient has tried Lyrica in the past and did not like how it made her feel.  She denies trialing Cymbalta.  I recommend that she start at 30 mg daily for 3 weeks and then increase to 60 mill grams daily for her neuropathic pain symptoms.  Otherwise I will refill her diclofenac as below.  I instructed the patient to stop this medication 3 days prior to her left-sided lumbar epidural steroid injection.  Plan of Care  Pharmacotherapy (Medications Ordered): Meds ordered this encounter  Medications  . DULoxetine (CYMBALTA) 30 MG capsule    Sig: 30 mg daily x 3 weeks then 60 mg daily    Dispense:  60 capsule    Refill:  3  . diclofenac (VOLTAREN) 75 MG EC tablet    Sig: Take 1 tablet (75 mg total) by mouth 2 (two) times daily after a meal.    Dispense:  60 tablet    Refill:  1   Lab-work, procedure(s), and/or referral(s): Orders Placed This Encounter  Procedures  . Lumbar Epidural  Injection   Time Note: Greater than 50% of the 25 minute(s) of face-to-face time spent with Ms. Wool, was spent in counseling/coordination of care regarding: Ms. Nored primary cause of pain, the treatment plan, treatment alternatives, the risks and possible complications of proposed treatment, medication side effects, going over the informed consent, the results, interpretation and significance of  her recent diagnostic interventional treatment(s), realistic expectations and the goals of pain management (increased in functionality).  Provider-requested follow-up: Return in about 4 weeks (around 03/12/2018).  No future appointments.  Primary Care Physician: Adin Hector, MD Location: [facility] Note by: Gillis Santa, M.D Date: 02/12/2018; Time: 12:21 PM  Patient Instructions   Mahaila is planning on having another lumbar epidural steroid injection treatment with me in September. We will plan on doing procedure on Wednesday. I recommend she stay out of work for at least 6 days after her lumbar procedure.  ----  Today's summary: 1. Start Cymbalta 30 mg daily x 3 weeks then take 60 mg daily  2. Schedule for LEFT ESI #2 without sedation 3. Refill of Diclofenac. Stop DICLOFENAC 3 days prior to procedure along with NSAIDSGENERAL RISKS AND COMPLICATIONS  What are the risk, side effects and possible complications? Generally speaking, most procedures are safe.  However, with any procedure there are risks, side effects, and the possibility of complications.  The risks and complications are dependent upon the sites that are lesioned, or the type of nerve block to be performed.  The closer the procedure is to the spine, the more serious the risks are.  Great care is taken when placing the radio frequency needles, block needles or lesioning probes, but sometimes complications can occur. 1. Infection: Any time there is an injection through the skin, there is a risk of  infection.  This is why sterile conditions are used for these blocks.  There are four possible types of infection. 1. Localized skin infection. 2. Central Nervous System Infection-This can be in the form of Meningitis, which can be deadly. 3. Epidural Infections-This can be in the form of an epidural abscess, which can cause pressure inside of the spine, causing compression of the spinal cord with subsequent paralysis. This would require an emergency surgery to decompress, and there are no guarantees that the patient would recover from the paralysis. 4. Discitis-This is an infection of the intervertebral discs.  It occurs in about 1%  of discography procedures.  It is difficult to treat and it may lead to surgery.        2. Pain: the needles have to go through skin and soft tissues, will cause soreness.       3. Damage to internal structures:  The nerves to be lesioned may be near blood vessels or    other nerves which can be potentially damaged.       4. Bleeding: Bleeding is more common if the patient is taking blood thinners such as  aspirin, Coumadin, Ticiid, Plavix, etc., or if he/she have some genetic predisposition  such as hemophilia. Bleeding into the spinal canal can cause compression of the spinal  cord with subsequent paralysis.  This would require an emergency surgery to  decompress and there are no guarantees that the patient would recover from the  paralysis.       5. Pneumothorax:  Puncturing of a lung is a possibility, every time a needle is introduced in  the area of the chest or upper back.  Pneumothorax refers to free air around the  collapsed lung(s), inside of the thoracic cavity (chest cavity).  Another two possible  complications related to a similar event would include: Hemothorax and Chylothorax.   These are variations of the Pneumothorax, where instead of air around the collapsed  lung(s), you may have blood or chyle, respectively.       6. Spinal headaches: They may occur with  any procedures in the area of the spine.       7. Persistent CSF (Cerebro-Spinal Fluid) leakage: This is a rare problem, but may occur  with prolonged intrathecal or epidural catheters either due to the formation of a fistulous  track or a dural tear.       8. Nerve damage: By working so close to the spinal cord, there is always a possibility of  nerve damage, which could be as serious as a permanent spinal cord injury with  paralysis.       9. Death:  Although rare, severe deadly allergic reactions known as "Anaphylactic  reaction" can occur to any of the medications used.      10. Worsening of the symptoms:  We can always make thing worse.  What are the chances of something like this happening? Chances of any of this occuring are extremely low.  By statistics, you have more of a chance of getting killed in a motor vehicle accident: while driving to the hospital than any of the above occurring .  Nevertheless, you should be aware that they are possibilities.  In general, it is similar to taking a shower.  Everybody knows that you can slip, hit your head and get killed.  Does that mean that you should not shower again?  Nevertheless always keep in mind that statistics do not mean anything if you happen to be on the wrong side of them.  Even if a procedure has a 1 (one) in a 1,000,000 (million) chance of going wrong, it you happen to be that one..Also, keep in mind that by statistics, you have more of a chance of having something go wrong when taking medications.  Who should not have this procedure? If you are on a blood thinning medication (e.g. Coumadin, Plavix, see list of "Blood Thinners"), or if you have an active infection going on, you should not have the procedure.  If you are taking any blood thinners, please inform your physician.  How should I prepare for  this procedure?  Do not eat or drink anything at least six hours prior to the procedure.  Bring a driver with you .  It cannot be a  taxi.  Come accompanied by an adult that can drive you back, and that is strong enough to help you if your legs get weak or numb from the local anesthetic.  Take all of your medicines the morning of the procedure with just enough water to swallow them.  If you have diabetes, make sure that you are scheduled to have your procedure done first thing in the morning, whenever possible.  If you have diabetes, take only half of your insulin dose and notify our nurse that you have done so as soon as you arrive at the clinic.  If you are diabetic, but only take blood sugar pills (oral hypoglycemic), then do not take them on the morning of your procedure.  You may take them after you have had the procedure.  Do not take aspirin or any aspirin-containing medications, at least eleven (11) days prior to the procedure.  They may prolong bleeding.  Wear loose fitting clothing that may be easy to take off and that you would not mind if it got stained with Betadine or blood.  Do not wear any jewelry or perfume  Remove any nail coloring.  It will interfere with some of our monitoring equipment.  NOTE: Remember that this is not meant to be interpreted as a complete list of all possible complications.  Unforeseen problems may occur.  BLOOD THINNERS The following drugs contain aspirin or other products, which can cause increased bleeding during surgery and should not be taken for 2 weeks prior to and 1 week after surgery.  If you should need take something for relief of minor pain, you may take acetaminophen which is found in Tylenol,m Datril, Anacin-3 and Panadol. It is not blood thinner. The products listed below are.  Do not take any of the products listed below in addition to any listed on your instruction sheet.  A.P.C or A.P.C with Codeine Codeine Phosphate Capsules #3 Ibuprofen Ridaura  ABC compound Congesprin Imuran rimadil  Advil Cope Indocin Robaxisal  Alka-Seltzer Effervescent Pain Reliever and  Antacid Coricidin or Coricidin-D  Indomethacin Rufen  Alka-Seltzer plus Cold Medicine Cosprin Ketoprofen S-A-C Tablets  Anacin Analgesic Tablets or Capsules Coumadin Korlgesic Salflex  Anacin Extra Strength Analgesic tablets or capsules CP-2 Tablets Lanoril Salicylate  Anaprox Cuprimine Capsules Levenox Salocol  Anexsia-D Dalteparin Magan Salsalate  Anodynos Darvon compound Magnesium Salicylate Sine-off  Ansaid Dasin Capsules Magsal Sodium Salicylate  Anturane Depen Capsules Marnal Soma  APF Arthritis pain formula Dewitt's Pills Measurin Stanback  Argesic Dia-Gesic Meclofenamic Sulfinpyrazone  Arthritis Bayer Timed Release Aspirin Diclofenac Meclomen Sulindac  Arthritis pain formula Anacin Dicumarol Medipren Supac  Analgesic (Safety coated) Arthralgen Diffunasal Mefanamic Suprofen  Arthritis Strength Bufferin Dihydrocodeine Mepro Compound Suprol  Arthropan liquid Dopirydamole Methcarbomol with Aspirin Synalgos  ASA tablets/Enseals Disalcid Micrainin Tagament  Ascriptin Doan's Midol Talwin  Ascriptin A/D Dolene Mobidin Tanderil  Ascriptin Extra Strength Dolobid Moblgesic Ticlid  Ascriptin with Codeine Doloprin or Doloprin with Codeine Momentum Tolectin  Asperbuf Duoprin Mono-gesic Trendar  Aspergum Duradyne Motrin or Motrin IB Triminicin  Aspirin plain, buffered or enteric coated Durasal Myochrisine Trigesic  Aspirin Suppositories Easprin Nalfon Trillsate  Aspirin with Codeine Ecotrin Regular or Extra Strength Naprosyn Uracel  Atromid-S Efficin Naproxen Ursinus  Auranofin Capsules Elmiron Neocylate Vanquish  Axotal Emagrin Norgesic Verin  Azathioprine Empirin or Empirin with Codeine  Normiflo Vitamin E  Azolid Emprazil Nuprin Voltaren  Bayer Aspirin plain, buffered or children's or timed BC Tablets or powders Encaprin Orgaran Warfarin Sodium  Buff-a-Comp Enoxaparin Orudis Zorpin  Buff-a-Comp with Codeine Equegesic Os-Cal-Gesic   Buffaprin Excedrin plain, buffered or Extra Strength  Oxalid   Bufferin Arthritis Strength Feldene Oxphenbutazone   Bufferin plain or Extra Strength Feldene Capsules Oxycodone with Aspirin   Bufferin with Codeine Fenoprofen Fenoprofen Pabalate or Pabalate-SF   Buffets II Flogesic Panagesic   Buffinol plain or Extra Strength Florinal or Florinal with Codeine Panwarfarin   Buf-Tabs Flurbiprofen Penicillamine   Butalbital Compound Four-way cold tablets Penicillin   Butazolidin Fragmin Pepto-Bismol   Carbenicillin Geminisyn Percodan   Carna Arthritis Reliever Geopen Persantine   Carprofen Gold's salt Persistin   Chloramphenicol Goody's Phenylbutazone   Chloromycetin Haltrain Piroxlcam   Clmetidine heparin Plaquenil   Cllnoril Hyco-pap Ponstel   Clofibrate Hydroxy chloroquine Propoxyphen         Before stopping any of these medications, be sure to consult the physician who ordered them.  Some, such as Coumadin (Warfarin) are ordered to prevent or treat serious conditions such as "deep thrombosis", "pumonary embolisms", and other heart problems.  The amount of time that you may need off of the medication may also vary with the medication and the reason for which you were taking it.  If you are taking any of these medications, please make sure you notify your pain physician before you undergo any procedures.         Epidural Steroid Injection Patient Information  Description: The epidural space surrounds the nerves as they exit the spinal cord.  In some patients, the nerves can be compressed and inflamed by a bulging disc or a tight spinal canal (spinal stenosis).  By injecting steroids into the epidural space, we can bring irritated nerves into direct contact with a potentially helpful medication.  These steroids act directly on the irritated nerves and can reduce swelling and inflammation which often leads to decreased pain.  Epidural steroids may be injected anywhere along the spine and from the neck to the low back depending upon the  location of your pain.   After numbing the skin with local anesthetic (like Novocaine), a small needle is passed into the epidural space slowly.  You may experience a sensation of pressure while this is being done.  The entire block usually last less than 10 minutes.  Conditions which may be treated by epidural steroids:   Low back and leg pain  Neck and arm pain  Spinal stenosis  Post-laminectomy syndrome  Herpes zoster (shingles) pain  Pain from compression fractures  Preparation for the injection:  1. Do not eat any solid food or dairy products within 8 hours of your appointment.  2. You may drink clear liquids up to 3 hours before appointment.  Clear liquids include water, black coffee, juice or soda.  No milk or cream please. 3. You may take your regular medication, including pain medications, with a sip of water before your appointment  Diabetics should hold regular insulin (if taken separately) and take 1/2 normal NPH dos the morning of the procedure.  Carry some sugar containing items with you to your appointment. 4. A driver must accompany you and be prepared to drive you home after your procedure.  5. Bring all your current medications with your. 6. An IV may be inserted and sedation may be given at the discretion of the physician.   7. A blood pressure  cuff, EKG and other monitors will often be applied during the procedure.  Some patients may need to have extra oxygen administered for a short period. 8. You will be asked to provide medical information, including your allergies, prior to the procedure.  We must know immediately if you are taking blood thinners (like Coumadin/Warfarin)  Or if you are allergic to IV iodine contrast (dye). We must know if you could possible be pregnant.  Possible side-effects:  Bleeding from needle site  Infection (rare, may require surgery)  Nerve injury (rare)  Numbness & tingling (temporary)  Difficulty urinating (rare,  temporary)  Spinal headache ( a headache worse with upright posture)  Light -headedness (temporary)  Pain at injection site (several days)  Decreased blood pressure (temporary)  Weakness in arm/leg (temporary)  Pressure sensation in back/neck (temporary)  Call if you experience:  Fever/chills associated with headache or increased back/neck pain.  Headache worsened by an upright position.  New onset weakness or numbness of an extremity below the injection site  Hives or difficulty breathing (go to the emergency room)  Inflammation or drainage at the infection site  Severe back/neck pain  Any new symptoms which are concerning to you  Please note:  Although the local anesthetic injected can often make your back or neck feel good for several hours after the injection, the pain will likely return.  It takes 3-7 days for steroids to work in the epidural space.  You may not notice any pain relief for at least that one week.  If effective, we will often do a series of three injections spaced 3-6 weeks apart to maximally decrease your pain.  After the initial series, we generally will wait several months before considering a repeat injection of the same type.  If you have any questions, please call [phone] [facility]

## 2018-02-12 NOTE — Patient Instructions (Addendum)
Rebekah Shaw is planning on having another lumbar epidural steroid injection treatment with me in September. We will plan on doing procedure on Wednesday. I recommend she stay out of work for at least 6 days after her lumbar procedure.  ----  Today's summary: 1. Start Cymbalta 30 mg daily x 3 weeks then take 60 mg daily  2. Schedule for LEFT ESI #2 without sedation 3. Refill of Diclofenac. Stop DICLOFENAC 3 days prior to procedure along with NSAIDSGENERAL RISKS AND COMPLICATIONS  What are the risk, side effects and possible complications? Generally speaking, most procedures are safe.  However, with any procedure there are risks, side effects, and the possibility of complications.  The risks and complications are dependent upon the sites that are lesioned, or the type of nerve block to be performed.  The closer the procedure is to the spine, the more serious the risks are.  Great care is taken when placing the radio frequency needles, block needles or lesioning probes, but sometimes complications can occur. 1. Infection: Any time there is an injection through the skin, there is a risk of infection.  This is why sterile conditions are used for these blocks.  There are four possible types of infection. 1. Localized skin infection. 2. Central Nervous System Infection-This can be in the form of Meningitis, which can be deadly. 3. Epidural Infections-This can be in the form of an epidural abscess, which can cause pressure inside of the spine, causing compression of the spinal cord with subsequent paralysis. This would require an emergency surgery to decompress, and there are no guarantees that the patient would recover from the paralysis. 4. Discitis-This is an infection of the intervertebral discs.  It occurs in about 1% of discography procedures.  It is difficult to treat and it may lead to surgery.        2. Pain: the needles have to go through skin and soft tissues, will cause soreness.       3. Damage to  internal structures:  The nerves to be lesioned may be near blood vessels or    other nerves which can be potentially damaged.       4. Bleeding: Bleeding is more common if the patient is taking blood thinners such as  aspirin, Coumadin, Ticiid, Plavix, etc., or if he/she have some genetic predisposition  such as hemophilia. Bleeding into the spinal canal can cause compression of the spinal  cord with subsequent paralysis.  This would require an emergency surgery to  decompress and there are no guarantees that the patient would recover from the  paralysis.       5. Pneumothorax:  Puncturing of a lung is a possibility, every time a needle is introduced in  the area of the chest or upper back.  Pneumothorax refers to free air around the  collapsed lung(s), inside of the thoracic cavity (chest cavity).  Another two possible  complications related to a similar event would include: Hemothorax and Chylothorax.   These are variations of the Pneumothorax, where instead of air around the collapsed  lung(s), you may have blood or chyle, respectively.       6. Spinal headaches: They may occur with any procedures in the area of the spine.       7. Persistent CSF (Cerebro-Spinal Fluid) leakage: This is a rare problem, but may occur  with prolonged intrathecal or epidural catheters either due to the formation of a fistulous  track or a dural tear.  8. Nerve damage: By working so close to the spinal cord, there is always a possibility of  nerve damage, which could be as serious as a permanent spinal cord injury with  paralysis.       9. Death:  Although rare, severe deadly allergic reactions known as "Anaphylactic  reaction" can occur to any of the medications used.      10. Worsening of the symptoms:  We can always make thing worse.  What are the chances of something like this happening? Chances of any of this occuring are extremely low.  By statistics, you have more of a chance of getting killed in a motor  vehicle accident: while driving to the hospital than any of the above occurring .  Nevertheless, you should be aware that they are possibilities.  In general, it is similar to taking a shower.  Everybody knows that you can slip, hit your head and get killed.  Does that mean that you should not shower again?  Nevertheless always keep in mind that statistics do not mean anything if you happen to be on the wrong side of them.  Even if a procedure has a 1 (one) in a 1,000,000 (million) chance of going wrong, it you happen to be that one..Also, keep in mind that by statistics, you have more of a chance of having something go wrong when taking medications.  Who should not have this procedure? If you are on a blood thinning medication (e.g. Coumadin, Plavix, see list of "Blood Thinners"), or if you have an active infection going on, you should not have the procedure.  If you are taking any blood thinners, please inform your physician.  How should I prepare for this procedure?  Do not eat or drink anything at least six hours prior to the procedure.  Bring a driver with you .  It cannot be a taxi.  Come accompanied by an adult that can drive you back, and that is strong enough to help you if your legs get weak or numb from the local anesthetic.  Take all of your medicines the morning of the procedure with just enough water to swallow them.  If you have diabetes, make sure that you are scheduled to have your procedure done first thing in the morning, whenever possible.  If you have diabetes, take only half of your insulin dose and notify our nurse that you have done so as soon as you arrive at the clinic.  If you are diabetic, but only take blood sugar pills (oral hypoglycemic), then do not take them on the morning of your procedure.  You may take them after you have had the procedure.  Do not take aspirin or any aspirin-containing medications, at least eleven (11) days prior to the procedure.  They may  prolong bleeding.  Wear loose fitting clothing that may be easy to take off and that you would not mind if it got stained with Betadine or blood.  Do not wear any jewelry or perfume  Remove any nail coloring.  It will interfere with some of our monitoring equipment.  NOTE: Remember that this is not meant to be interpreted as a complete list of all possible complications.  Unforeseen problems may occur.  BLOOD THINNERS The following drugs contain aspirin or other products, which can cause increased bleeding during surgery and should not be taken for 2 weeks prior to and 1 week after surgery.  If you should need take something for relief of minor  pain, you may take acetaminophen which is found in Tylenol,m Datril, Anacin-3 and Panadol. It is not blood thinner. The products listed below are.  Do not take any of the products listed below in addition to any listed on your instruction sheet.  A.P.C or A.P.C with Codeine Codeine Phosphate Capsules #3 Ibuprofen Ridaura  ABC compound Congesprin Imuran rimadil  Advil Cope Indocin Robaxisal  Alka-Seltzer Effervescent Pain Reliever and Antacid Coricidin or Coricidin-D  Indomethacin Rufen  Alka-Seltzer plus Cold Medicine Cosprin Ketoprofen S-A-C Tablets  Anacin Analgesic Tablets or Capsules Coumadin Korlgesic Salflex  Anacin Extra Strength Analgesic tablets or capsules CP-2 Tablets Lanoril Salicylate  Anaprox Cuprimine Capsules Levenox Salocol  Anexsia-D Dalteparin Magan Salsalate  Anodynos Darvon compound Magnesium Salicylate Sine-off  Ansaid Dasin Capsules Magsal Sodium Salicylate  Anturane Depen Capsules Marnal Soma  APF Arthritis pain formula Dewitt's Pills Measurin Stanback  Argesic Dia-Gesic Meclofenamic Sulfinpyrazone  Arthritis Bayer Timed Release Aspirin Diclofenac Meclomen Sulindac  Arthritis pain formula Anacin Dicumarol Medipren Supac  Analgesic (Safety coated) Arthralgen Diffunasal Mefanamic Suprofen  Arthritis Strength Bufferin  Dihydrocodeine Mepro Compound Suprol  Arthropan liquid Dopirydamole Methcarbomol with Aspirin Synalgos  ASA tablets/Enseals Disalcid Micrainin Tagament  Ascriptin Doan's Midol Talwin  Ascriptin A/D Dolene Mobidin Tanderil  Ascriptin Extra Strength Dolobid Moblgesic Ticlid  Ascriptin with Codeine Doloprin or Doloprin with Codeine Momentum Tolectin  Asperbuf Duoprin Mono-gesic Trendar  Aspergum Duradyne Motrin or Motrin IB Triminicin  Aspirin plain, buffered or enteric coated Durasal Myochrisine Trigesic  Aspirin Suppositories Easprin Nalfon Trillsate  Aspirin with Codeine Ecotrin Regular or Extra Strength Naprosyn Uracel  Atromid-S Efficin Naproxen Ursinus  Auranofin Capsules Elmiron Neocylate Vanquish  Axotal Emagrin Norgesic Verin  Azathioprine Empirin or Empirin with Codeine Normiflo Vitamin E  Azolid Emprazil Nuprin Voltaren  Bayer Aspirin plain, buffered or children's or timed BC Tablets or powders Encaprin Orgaran Warfarin Sodium  Buff-a-Comp Enoxaparin Orudis Zorpin  Buff-a-Comp with Codeine Equegesic Os-Cal-Gesic   Buffaprin Excedrin plain, buffered or Extra Strength Oxalid   Bufferin Arthritis Strength Feldene Oxphenbutazone   Bufferin plain or Extra Strength Feldene Capsules Oxycodone with Aspirin   Bufferin with Codeine Fenoprofen Fenoprofen Pabalate or Pabalate-SF   Buffets II Flogesic Panagesic   Buffinol plain or Extra Strength Florinal or Florinal with Codeine Panwarfarin   Buf-Tabs Flurbiprofen Penicillamine   Butalbital Compound Four-way cold tablets Penicillin   Butazolidin Fragmin Pepto-Bismol   Carbenicillin Geminisyn Percodan   Carna Arthritis Reliever Geopen Persantine   Carprofen Gold's salt Persistin   Chloramphenicol Goody's Phenylbutazone   Chloromycetin Haltrain Piroxlcam   Clmetidine heparin Plaquenil   Cllnoril Hyco-pap Ponstel   Clofibrate Hydroxy chloroquine Propoxyphen         Before stopping any of these medications, be sure to consult the  physician who ordered them.  Some, such as Coumadin (Warfarin) are ordered to prevent or treat serious conditions such as "deep thrombosis", "pumonary embolisms", and other heart problems.  The amount of time that you may need off of the medication may also vary with the medication and the reason for which you were taking it.  If you are taking any of these medications, please make sure you notify your pain physician before you undergo any procedures.         Epidural Steroid Injection Patient Information  Description: The epidural space surrounds the nerves as they exit the spinal cord.  In some patients, the nerves can be compressed and inflamed by a bulging disc or a tight spinal canal (spinal stenosis).  By injecting  steroids into the epidural space, we can bring irritated nerves into direct contact with a potentially helpful medication.  These steroids act directly on the irritated nerves and can reduce swelling and inflammation which often leads to decreased pain.  Epidural steroids may be injected anywhere along the spine and from the neck to the low back depending upon the location of your pain.   After numbing the skin with local anesthetic (like Novocaine), a small needle is passed into the epidural space slowly.  You may experience a sensation of pressure while this is being done.  The entire block usually last less than 10 minutes.  Conditions which may be treated by epidural steroids:   Low back and leg pain  Neck and arm pain  Spinal stenosis  Post-laminectomy syndrome  Herpes zoster (shingles) pain  Pain from compression fractures  Preparation for the injection:  1. Do not eat any solid food or dairy products within 8 hours of your appointment.  2. You may drink clear liquids up to 3 hours before appointment.  Clear liquids include water, black coffee, juice or soda.  No milk or cream please. 3. You may take your regular medication, including pain medications, with a  sip of water before your appointment  Diabetics should hold regular insulin (if taken separately) and take 1/2 normal NPH dos the morning of the procedure.  Carry some sugar containing items with you to your appointment. 4. A driver must accompany you and be prepared to drive you home after your procedure.  5. Bring all your current medications with your. 6. An IV may be inserted and sedation may be given at the discretion of the physician.   7. A blood pressure cuff, EKG and other monitors will often be applied during the procedure.  Some patients may need to have extra oxygen administered for a short period. 8. You will be asked to provide medical information, including your allergies, prior to the procedure.  We must know immediately if you are taking blood thinners (like Coumadin/Warfarin)  Or if you are allergic to IV iodine contrast (dye). We must know if you could possible be pregnant.  Possible side-effects:  Bleeding from needle site  Infection (rare, may require surgery)  Nerve injury (rare)  Numbness & tingling (temporary)  Difficulty urinating (rare, temporary)  Spinal headache ( a headache worse with upright posture)  Light -headedness (temporary)  Pain at injection site (several days)  Decreased blood pressure (temporary)  Weakness in arm/leg (temporary)  Pressure sensation in back/neck (temporary)  Call if you experience:  Fever/chills associated with headache or increased back/neck pain.  Headache worsened by an upright position.  New onset weakness or numbness of an extremity below the injection site  Hives or difficulty breathing (go to the emergency room)  Inflammation or drainage at the infection site  Severe back/neck pain  Any new symptoms which are concerning to you  Please note:  Although the local anesthetic injected can often make your back or neck feel good for several hours after the injection, the pain will likely return.  It takes 3-7  days for steroids to work in the epidural space.  You may not notice any pain relief for at least that one week.  If effective, we will often do a series of three injections spaced 3-6 weeks apart to maximally decrease your pain.  After the initial series, we generally will wait several months before considering a repeat injection of the same type.  If you  have any questions, please call [phone] [facility]

## 2018-02-12 NOTE — Progress Notes (Signed)
Safety precautions to be maintained throughout the outpatient stay will include: orient to surroundings, keep bed in low position, maintain call bell within reach at all times, provide assistance with transfer out of bed and ambulation.  

## 2018-03-13 ENCOUNTER — Ambulatory Visit
Admission: RE | Admit: 2018-03-13 | Discharge: 2018-03-13 | Disposition: A | Discharge status: Home / Self Care | Payer: Medicare HMO | Source: Ambulatory Visit | Attending: Student in an Organized Health Care Education/Training Program | Admitting: Student in an Organized Health Care Education/Training Program

## 2018-03-13 ENCOUNTER — Other Ambulatory Visit: Payer: Self-pay

## 2018-03-13 ENCOUNTER — Encounter: Payer: Self-pay | Admitting: Student in an Organized Health Care Education/Training Program

## 2018-03-13 ENCOUNTER — Ambulatory Visit (HOSPITAL_BASED_OUTPATIENT_CLINIC_OR_DEPARTMENT_OTHER): Payer: Medicare HMO | Admitting: Student in an Organized Health Care Education/Training Program

## 2018-03-13 DIAGNOSIS — Z888 Allergy status to other drugs, medicaments and biological substances status: Secondary | ICD-10-CM | POA: Insufficient documentation

## 2018-03-13 DIAGNOSIS — M5416 Radiculopathy, lumbar region: Secondary | ICD-10-CM | POA: Diagnosis present

## 2018-03-13 DIAGNOSIS — Z791 Long term (current) use of non-steroidal anti-inflammatories (NSAID): Secondary | ICD-10-CM | POA: Insufficient documentation

## 2018-03-13 DIAGNOSIS — Z79899 Other long term (current) drug therapy: Secondary | ICD-10-CM | POA: Insufficient documentation

## 2018-03-13 MED ORDER — IOPAMIDOL (ISOVUE-M 200) INJECTION 41%
10.0000 mL | Freq: Once | INTRAMUSCULAR | Status: AC
  Administered 2018-03-13: 10 mL via EPIDURAL
  Filled 2018-03-13: qty 10

## 2018-03-13 MED ORDER — SODIUM CHLORIDE 0.9% FLUSH
2.0000 mL | Freq: Once | INTRAVENOUS | Status: AC
  Administered 2018-03-13: 10 mL

## 2018-03-13 MED ORDER — LIDOCAINE HCL 2 % IJ SOLN
10.0000 mL | Freq: Once | INTRAMUSCULAR | Status: AC
  Administered 2018-03-13: 400 mg
  Filled 2018-03-13: qty 20

## 2018-03-13 MED ORDER — ROPIVACAINE HCL 2 MG/ML IJ SOLN
2.0000 mL | Freq: Once | INTRAMUSCULAR | Status: AC
  Administered 2018-03-13: 10 mL via EPIDURAL
  Filled 2018-03-13: qty 10

## 2018-03-13 MED ORDER — DEXAMETHASONE SODIUM PHOSPHATE 10 MG/ML IJ SOLN
10.0000 mg | Freq: Once | INTRAMUSCULAR | Status: AC
  Administered 2018-03-13: 10 mg
  Filled 2018-03-13: qty 1

## 2018-03-13 NOTE — Progress Notes (Signed)
Patient's Name: Rebekah Shaw  MRN: [id number]  Referring Provider: Edward Jolly, MD  DOB: 1951-02-10  PCP: Lynnea Ferrier, MD  DOS: 03/13/2018  Note by: Edward Jolly, MD  Service setting: Ambulatory outpatient  Specialty: Interventional Pain Management  Patient type: Established  Location: [facility] (AMB) Pain Management Facility  Visit type: Interventional Procedure   Primary Reason for Visit: Interventional Pain Management Treatment. CC: Back Pain (lower, left side is worse)  Procedure:          Anesthesia, Analgesia, Anxiolysis:  Type: Therapeutic Inter-Laminar Epidural Steroid Injection #2  Region: Lumbar Level: L4-5 Level. Laterality: Left-Sided         Type: Local Anesthesia Indication(s): Analgesia         Route: Infiltration (Peculiar/IM) IV Access: Declined Sedation: Declined  Local Anesthetic: Lidocaine 1-2%   Indications: 1. Lumbar radiculopathy     Pain Score: Pre-procedure: 6 /10 Post-procedure: 0-No pain/10  Pre-op Assessment:  Rebekah Shaw is a 67 y.o. (year old), female patient, seen today for interventional treatment. She  has a past surgical history that includes Tonsillectomy and adenoidectomy; Appendectomy; Foot surgery; and Vascular surgery (Bilateral, 2009). Rebekah Shaw has a current medication list which includes the following prescription(s): b complex vitamins, biotin, diclofenac sodium, fexofenadine, ibuprofen, magnesium, multivitamin-iron-minerals-folic acid, fish oil, UNABLE TO FIND, and cholecalciferol. Her primarily concern today is the Back Pain (lower, left side is worse)  Initial Vital Signs:  Pulse/HCG Rate: 65ECG Heart Rate: 81 Temp: 97.8 F (36.6 C) Resp: 16 BP: (!) 146/64 SpO2: 99 %  BMI: Estimated body mass index is 29.29 kg/m as calculated from the following:   Height as of this encounter: 5' (1.524 m).   Weight as of this encounter: 150 lb (68 kg).  Risk Assessment: Allergies: Reviewed. She is allergic to prednisone; atorvastatin; ezetimibe;  ibandronic acid; pravastatin; rosuvastatin; simvastatin; and meloxicam.  Allergy Precautions: None required Coagulopathies: Reviewed. None identified.  Blood-thinner therapy: None at this time Active Infection(s): Reviewed. None identified. Rebekah Shaw is afebrile  Site Confirmation: Rebekah Shaw was asked to confirm the procedure and laterality before marking the site Procedure checklist: Completed Consent: Before the procedure and under the influence of no sedative(s), amnesic(s), or anxiolytics, the patient was informed of the treatment options, risks and possible complications. To fulfill our ethical and legal obligations, as recommended by the American Medical Association's Code of Ethics, I have informed the patient of my clinical impression; the nature and purpose of the treatment or procedure; the risks, benefits, and possible complications of the intervention; the alternatives, including doing nothing; the risk(s) and benefit(s) of the alternative treatment(s) or procedure(s); and the risk(s) and benefit(s) of doing nothing. The patient was provided information about the general risks and possible complications associated with the procedure. These may include, but are not limited to: failure to achieve desired goals, infection, bleeding, organ or nerve damage, allergic reactions, paralysis, and death. In addition, the patient was informed of those risks and complications associated to Spine-related procedures, such as failure to decrease pain; infection (i.e.: Meningitis, epidural or intraspinal abscess); bleeding (i.e.: epidural hematoma, subarachnoid hemorrhage, or any other type of intraspinal or peri-dural bleeding); organ or nerve damage (i.e.: Any type of peripheral nerve, nerve root, or spinal cord injury) with subsequent damage to sensory, motor, and/or autonomic systems, resulting in permanent pain, numbness, and/or weakness of one or several areas of the body; allergic reactions; (i.e.:  anaphylactic reaction); and/or death. Furthermore, the patient was informed of those risks and complications associated  with the medications. These include, but are not limited to: allergic reactions (i.e.: anaphylactic or anaphylactoid reaction(s)); adrenal axis suppression; blood sugar elevation that in diabetics may result in ketoacidosis or comma; water retention that in patients with history of congestive heart failure may result in shortness of breath, pulmonary edema, and decompensation with resultant heart failure; weight gain; swelling or edema; medication-induced neural toxicity; particulate matter embolism and blood vessel occlusion with resultant organ, and/or nervous system infarction; and/or aseptic necrosis of one or more joints. Finally, the patient was informed that Medicine is not an exact science; therefore, there is also the possibility of unforeseen or unpredictable risks and/or possible complications that may result in a catastrophic outcome. The patient indicated having understood very clearly. We have given the patient no guarantees and we have made no promises. Enough time was given to the patient to ask questions, all of which were answered to the patient's satisfaction. Rebekah Shaw has indicated that she wanted to continue with the procedure. Attestation: I, the ordering provider, attest that I have discussed with the patient the benefits, risks, side-effects, alternatives, likelihood of achieving goals, and potential problems during recovery for the procedure that I have provided informed consent. Date  Time: 03/13/2018  9:17 AM  Pre-Procedure Preparation:  Monitoring: As per clinic protocol. Respiration, ETCO2, SpO2, BP, heart rate and rhythm monitor placed and checked for adequate function Safety Precautions: Patient was assessed for positional comfort and pressure points before starting the procedure. Time-out: I initiated and conducted the "Time-out" before starting the  procedure, as per protocol. The patient was asked to participate by confirming the accuracy of the "Time Out" information. Verification of the correct person, site, and procedure were performed and confirmed by me, the nursing staff, and the patient. "Time-out" conducted as per Joint Commission's Universal Protocol (UP.01.01.01). Time: 1033  Description of Procedure:          Position: Prone with head of the table was raised to facilitate breathing. Target Area: The interlaminar space, initially targeting the lower laminar border of the superior vertebral body. Approach: Paramedial approach. Area Prepped: Entire Posterior Lumbar Region Prepping solution: ChloraPrep (2% chlorhexidine gluconate and 70% isopropyl alcohol) Safety Precautions: Aspiration looking for blood return was conducted prior to all injections. At no point did we inject any substances, as a needle was being advanced. No attempts were made at seeking any paresthesias. Safe injection practices and needle disposal techniques used. Medications properly checked for expiration dates. SDV (single dose vial) medications used. Description of the Procedure: Protocol guidelines were followed. The procedure needle was introduced through the skin, ipsilateral to the reported pain, and advanced to the target area. Bone was contacted and the needle walked caudad, until the lamina was cleared. The epidural space was identified using "loss-of-resistance technique" with 2-3 ml of PF-NaCl (0.9% NSS), in a 5cc LOR glass syringe. Vitals:   03/13/18 0935 03/13/18 1034 03/13/18 1038 03/13/18 1044  BP: (!) 146/64 (!) 142/81 (!) 154/95 (!) 143/69  Pulse: 65     Resp: 16 17 11 15   Temp: 97.8 F (36.6 C)     SpO2: 99% 98% 98% 99%  Weight: 150 lb (68 kg)     Height: 5' (1.524 m)       Start Time: 1033 hrs. End Time: 1041 hrs. Materials:  Needle(s) Type: Epidural needle Gauge: 17G Length: 3.5-in Medication(s): Please see orders for medications and  dosing details. 10 CC solution made of 7cc of preservative-free saline, 2 cc of 0.2% ropivacaine,  1 cc of Decadron 10 mg/cc Imaging Guidance (Spinal):          Type of Imaging Technique: Fluoroscopy Guidance (Spinal) Indication(s): Assistance in needle guidance and placement for procedures requiring needle placement in or near specific anatomical locations not easily accessible without such assistance. Exposure Time: Please see nurses notes. Contrast: Before injecting any contrast, we confirmed that the patient did not have an allergy to iodine, shellfish, or radiological contrast. Once satisfactory needle placement was completed at the desired level, radiological contrast was injected. Contrast injected under live fluoroscopy. No contrast complications. See chart for type and volume of contrast used. Fluoroscopic Guidance: I was personally present during the use of fluoroscopy. "Tunnel Vision Technique" used to obtain the best possible view of the target area. Parallax error corrected before commencing the procedure. "Direction-depth-direction" technique used to introduce the needle under continuous pulsed fluoroscopy. Once target was reached, antero-posterior, oblique, and lateral fluoroscopic projection used confirm needle placement in all planes. Images permanently stored in EMR. Interpretation: I personally interpreted the imaging intraoperatively. Adequate needle placement confirmed in multiple planes. Appropriate spread of contrast into desired area was observed. No evidence of afferent or efferent intravascular uptake. No intrathecal or subarachnoid spread observed. Permanent images saved into the patient's record.  Antibiotic Prophylaxis:   Anti-infectives (From admission, onward)   None     Indication(s): None identified  Post-operative Assessment:  Post-procedure Vital Signs:  Pulse/HCG Rate: 6576 Temp: 97.8 F (36.6 C) Resp: 15 BP: (!) 143/69 SpO2: 99 %  EBL:  None  Complications: No immediate post-treatment complications observed by team, or reported by patient.  Note: The patient tolerated the entire procedure well. A repeat set of vitals were taken after the procedure and the patient was kept under observation following institutional policy, for this type of procedure. Post-procedural neurological assessment was performed, showing return to baseline, prior to discharge. The patient was provided with post-procedure discharge instructions, including a section on how to identify potential problems. Should any problems arise concerning this procedure, the patient was given instructions to immediately contact us, at any time, without hesitation. In any case, we plan to contact the patient by telephone for a follow-up status report regarding this interventional procedure.  Comments:  No additional relevant information. 5 out of 5 strength bilateral lower extremity: Plantar flexion, dorsiflexion, knee flexion, knee extension.  Plan of Care    Imaging Orders     DG C-Arm 1-60 Min-No Report Procedure Orders    No procedure(s) ordered today    Medications ordered for procedure: Meds ordered this encounter  Medications  . iopamidol (ISOVUE-M) 41 % intrathecal injection 10 mL  . ropivacaine (PF) 2 mg/mL (0.2%) (NAROPIN) injection 2 mL  . sodium chloride flush (NS) 0.9 % injection 2 mL  . lidocaine (XYLOCAINE) 2 % (with pres) injection 200 mg  . dexamethasone (DECADRON) injection 10 mg   Medications administered: We administered iopamidol, ropivacaine (PF) 2 mg/mL (0.2%), sodium chloride flush, lidocaine, and dexamethasone.  See the medical record for exact dosing, route, and time of administration.  New Prescriptions   No medications on file   Disposition: Discharge home  Discharge Date & Time: 03/13/2018; 1050 hrs.   Physician-requested Follow-up: Return in about 4 weeks (around 04/10/2018) for Post Procedure Evaluation.  Future Appointments   Date Time Provider Department Center  04/11/2018 10:30 AM Edward Jolly, MD [facility] None   Primary Care Physician: Lynnea Ferrier, MD Location: [facility] Note by: Edward Jolly, MD  Date: 03/13/2018; Time: 11:39 AM  Disclaimer:  Medicine is not an Visual merchandiser. The only guarantee in medicine is that nothing is guaranteed. It is important to note that the decision to proceed with this intervention was based on the information collected from the patient. The Data and conclusions were drawn from the patient's questionnaire, the interview, and the physical examination. Because the information was provided in large part by the patient, it cannot be guaranteed that it has not been purposely or unconsciously manipulated. Every effort has been made to obtain as much relevant data as possible for this evaluation. It is important to note that the conclusions that lead to this procedure are derived in large part from the available data. Always take into account that the treatment will also be dependent on availability of resources and existing treatment guidelines, considered by other Pain Management Practitioners as being common knowledge and practice, at the time of the intervention. For Medico-Legal purposes, it is also important to point out that variation in procedural techniques and pharmacological choices are the acceptable norm. The indications, contraindications, technique, and results of the above procedure should only be interpreted and judged by a Board-Certified Interventional Pain Specialist with extensive familiarity and expertise in the same exact procedure and technique.

## 2018-03-13 NOTE — Progress Notes (Signed)
Safety precautions to be maintained throughout the outpatient stay will include: orient to surroundings, keep bed in low position, maintain call bell within reach at all times, provide assistance with transfer out of bed and ambulation.  

## 2018-03-13 NOTE — Patient Instructions (Signed)
____________________________________________________________________________________________  Post-procedure Information What to expect: Most procedures involve the use of a local anesthetic (numbing medicine), and a steroid (anti-inflammatory medicine).  The local anesthetics may cause temporary numbness and weakness of the legs or arms, depending on the location of the block. This numbness/weakness may last 4-6 hours, depending on the local anesthetic used. In rare instances, it can last up to 24 hours. While numb, you must be very careful not to injure the extremity.  After any procedure, you could expect the pain to get better within 15-20 minutes. This relief is temporary and may last 4-6 hours. Once the local anesthetics wears off, you could experience discomfort, possibly more than usual, for up to 10 (ten) days. In the case of radiofrequencies, it may last up to 6 weeks. Surgeries may take up to 8 weeks for the healing process. The discomfort is due to the irritation caused by needles going through skin and muscle. To minimize the discomfort, we recommend using ice the first day, and heat from then on. The ice should be applied for 15 minutes on, and 15 minutes off. Keep repeating this cycle until bedtime. Avoid applying the ice directly to the skin, to prevent frostbite. Heat should be used daily, until the pain improves (4-10 days). Be careful not to burn yourself.  Occasionally you may experience muscle spasms or cramps. These occur as a consequence of the irritation caused by the needle sticks to the muscle and the blood that will inevitably be lost into the surrounding muscle tissue. Blood tends to be very irritating to tissues, which tend to react by going into spasm. These spasms may start the same day of your procedure, but they may also take days to develop. This late onset type of spasm or cramp is usually caused by electrolyte imbalances triggered by the steroids, at the level of the  kidney. Cramps and spasms tend to respond well to muscle relaxants, multivitamins (some are triggered by the procedure, but may have their origins in vitamin deficiencies), and "Gatorade", or any sports drinks that can replenish any electrolyte imbalances. (If you are a diabetic, ask your pharmacist to get you a sugar-free brand.) Warm showers or baths may also be helpful. Stretching exercises are highly recommended.  General Instructions:  Be alert for signs of possible infection: redness, swelling, heat, red streaks, elevated temperature, and/or fever. These typically appear 4 to 6 days after the procedure. Immediately notify your doctor if you experience unusual bleeding, difficulty breathing, or loss of bowel or bladder control. If you experience increased pain, do not increase your pain medicine intake, unless instructed by your pain physician.  Post-Procedure Care:  Be careful in moving about. Muscle spasms in the area of the injection may occur. Applying ice or heat to the area is often helpful. The incidence of spinal headaches after epidural injections ranges between 1.4% and 6%. If you develop a headache that does not seem to respond to conservative therapy, please let your physician know. This can be treated with an epidural blood patch.   Post-procedure numbness or redness is to be expected, however it should average 4 to 6 hours. If numbness and weakness of your extremities begins to develop 4 to 6 hours after your procedure, and is felt to be progressing and worsening, immediately contact your physician.  Diet:  If you experience nausea, do not eat until this sensation goes away. If you had a "Stellate Ganglion Block" for upper extremity "Reflex Sympathetic Dystrophy", do not eat or   drink until your hoarseness goes away. In any case, always start with liquids first and if you tolerate them well, then slowly progress to more solid foods.  Activity:  For the first 4 to 6 hours after the  procedure, use caution in moving about as you may experience numbness and/or weakness. Use caution in cooking, using household electrical appliances, and climbing steps. If you need to reach your Doctor call our office: [phone] (During business hours) or [phone] (After business hours).  Business Hours: Monday-Thursday 8:00 am - 4:00 PM    Fridays: Closed     In case of an emergency: In case of emergency, call 911 or go to the nearest emergency room and have the physician there call us.  Interpretation of Procedure Every nerve block has two components: a diagnostic component, and a treatment component. Unrealistic expectations are the most common causes of "perceived failure".  In a perfect world, a single nerve block should be able to completely and permanently eliminate the pain. Sadly, the world is not perfect.  Most pain management nerve blocks are performed using local anesthetics and steroids. Steroids are responsible for any long-term benefit that you may experience. Their purpose is to decrease any chronic swelling that may exist in the area. Steroids begin to work immediately after being injected. However, most patients will not experience any benefits until 5 to 10 days after the injection, when the swelling has come down to the point where they can tell a difference. Steroids will only help if there is swelling to be treated. As such, they can assist with the diagnosis. If effective, they suggest an inflammatory component to the pain, and if ineffective, they rule out inflammation as the main cause or component of the problem. If the problem is one of mechanical compression, you will get no benefit from those steroids.   In the case of local anesthetics, they have a crucial role in the diagnosis of your condition. Most will begin to work within15 to 20 minutes after injection. The duration will depend on the type used (short- vs. Long-acting). It is of outmost importance that  patients keep tract of their pain, after the procedure. To assist with this matter, a "Post-procedure Pain Diary" is provided. Make sure to complete it and to bring it back to your follow-up appointment.  As long as the patient keeps accurate, detailed records of their symptoms after every procedure, and returns to have those interpreted, every procedure will provide us with invaluable information. Even a block that does not provide the patient with any relief, will always provide us with information about the mechanism and the origin of the pain. The only time a nerve block can be considered a waste of time is when patients do not keep track of the results, or do not keep their post-procedure appointment.  Reporting the results back to your physician The Pain Score  Pain is a subjective complaint. It cannot be seen, touched, or measured. We depend entirely on the patient's report of the pain in order to assess your condition and treatment. To evaluate the pain, we use a pain scale, where "0" means "No Pain", and a "10" is "the worst possible pain that you can even imagine" (i.e. something like been eaten alive by a shark or being torn apart by a lion).   Use the Pain Scale provided. You will frequently be asked to rate your pain. Please be accurate, remember that medical decisions will be based on your   responses. Please do not rate your pain above a 10. Doing so is actually interpreted as "symptom magnification" (exaggeration). To put this into perspective, when you tell us that your pain is at a 10 (ten), what you are saying is that there is nothing we can do to make this pain any worse. (Carefully think about that.) ____________________________________________________________________________________________   Pain Management Discharge Instructions  General Discharge Instructions :  If you need to reach your doctor call: Monday-Friday 8:00 am - 4:00 pm at [phone] or toll free [phone].   After clinic hours [phone] to have operator reach doctor.  Bring all of your medication bottles to all your appointments in the pain clinic.  To cancel or reschedule your appointment with Pain Management please remember to call 24 hours in advance to avoid a fee.  Refer to the educational materials which you have been given on: General Risks, I had my Procedure. Discharge Instructions, Post Sedation.  Post Procedure Instructions:  The drugs you were given will stay in your system until tomorrow, so for the next 24 hours you should not drive, make any legal decisions or drink any alcoholic beverages.  You may eat anything you prefer, but it is better to start with liquids then soups and crackers, and gradually work up to solid foods.  Please notify your doctor immediately if you have any unusual bleeding, trouble breathing or pain that is not related to your normal pain.  Depending on the type of procedure that was done, some parts of your body may feel week and/or numb.  This usually clears up by tonight or the next day.  Walk with the use of an assistive device or accompanied by an adult for the 24 hours.  You may use ice on the affected area for the first 24 hours.  Put ice in a Ziploc bag and cover with a towel and place against area 15 minutes on 15 minutes off.  You may switch to heat after 24 hours. 

## 2018-04-11 ENCOUNTER — Ambulatory Visit
Payer: Medicare HMO | Attending: Student in an Organized Health Care Education/Training Program | Admitting: Student in an Organized Health Care Education/Training Program

## 2018-04-11 ENCOUNTER — Encounter: Payer: Self-pay | Admitting: Student in an Organized Health Care Education/Training Program

## 2018-04-11 VITALS — BP 145/75 | HR 73 | Temp 98.4°F | Resp 18 | Ht 60.0 in | Wt 150.0 lb

## 2018-04-11 DIAGNOSIS — M5116 Intervertebral disc disorders with radiculopathy, lumbar region: Secondary | ICD-10-CM | POA: Insufficient documentation

## 2018-04-11 DIAGNOSIS — Z79899 Other long term (current) drug therapy: Secondary | ICD-10-CM | POA: Insufficient documentation

## 2018-04-11 DIAGNOSIS — M4856XA Collapsed vertebra, not elsewhere classified, lumbar region, initial encounter for fracture: Secondary | ICD-10-CM | POA: Diagnosis not present

## 2018-04-11 DIAGNOSIS — Z888 Allergy status to other drugs, medicaments and biological substances status: Secondary | ICD-10-CM | POA: Insufficient documentation

## 2018-04-11 DIAGNOSIS — M5136 Other intervertebral disc degeneration, lumbar region: Secondary | ICD-10-CM

## 2018-04-11 DIAGNOSIS — Z791 Long term (current) use of non-steroidal anti-inflammatories (NSAID): Secondary | ICD-10-CM | POA: Diagnosis not present

## 2018-04-11 DIAGNOSIS — M1288 Other specific arthropathies, not elsewhere classified, other specified site: Secondary | ICD-10-CM | POA: Insufficient documentation

## 2018-04-11 DIAGNOSIS — G894 Chronic pain syndrome: Secondary | ICD-10-CM | POA: Insufficient documentation

## 2018-04-11 DIAGNOSIS — E785 Hyperlipidemia, unspecified: Secondary | ICD-10-CM | POA: Diagnosis not present

## 2018-04-11 DIAGNOSIS — S32050S Wedge compression fracture of fifth lumbar vertebra, sequela: Secondary | ICD-10-CM | POA: Diagnosis not present

## 2018-04-11 DIAGNOSIS — M5416 Radiculopathy, lumbar region: Secondary | ICD-10-CM | POA: Diagnosis not present

## 2018-04-11 DIAGNOSIS — M25552 Pain in left hip: Secondary | ICD-10-CM | POA: Diagnosis not present

## 2018-04-11 DIAGNOSIS — M810 Age-related osteoporosis without current pathological fracture: Secondary | ICD-10-CM | POA: Diagnosis not present

## 2018-04-11 DIAGNOSIS — M4726 Other spondylosis with radiculopathy, lumbar region: Secondary | ICD-10-CM | POA: Insufficient documentation

## 2018-04-11 NOTE — Progress Notes (Signed)
Patient's Name: Rebekah Shaw  MRN: [id number]  Referring Provider: Adin Hector, MD  DOB: Jan 03, 1951  PCP: Adin Hector, MD  DOS: 04/11/2018  Note by: Gillis Santa, MD  Service setting: Ambulatory outpatient  Specialty: Interventional Pain Management  Location: [facility] (AMB) Pain Management Facility    Patient type: Established   Primary Reason(s) for Visit: Encounter for post-procedure evaluation of chronic illness with mild to moderate exacerbation CC: Hip Pain (LEFT)  HPI  [name] is a 67 y.o. year old, female patient, who comes today for a post-procedure evaluation. She has Lumbar radiculopathy (Right L4/5); Lumbar spondylosis; Lumbar facet arthropathy; Lumbar degenerative disc disease; Compression fracture of L5 lumbar vertebra, sequela; and Chronic pain syndrome on their problem list. Her primarily concern today is the Hip Pain (LEFT)  Pain Assessment: Location: Left Hip Radiating: left leg to just below the knee Onset: More than a month ago Duration: Chronic pain Quality: Throbbing Severity: 2 /10 (subjective, self-reported pain score)  Note: Reported level is compatible with observation.                         When using our objective Pain Scale, levels between 6 and 10/10 are said to belong in an emergency room, as it progressively worsens from a 6/10, described as severely limiting, requiring emergency care not usually available at an outpatient pain management facility. At a 6/10 level, communication becomes difficult and requires great effort. Assistance to reach the emergency department may be required. Facial flushing and profuse sweating along with potentially dangerous increases in heart rate and blood pressure will be evident. Effect on ADL:   Timing: Intermittent Modifying factors: Ibuprofen BP: (!) 145/75  HR: 73  [name] comes in today for post-procedure evaluation.  Further details on both, my assessment(s), as well as the proposed treatment plan, please see  below.  Post-Procedure Assessment  03/13/2018 Procedure: Left L4-L5 ESI #2 Pre-procedure pain score:  6/10 Post-procedure pain score: 0/10         Influential Factors: BMI: 29.29 kg/m Intra-procedural challenges: None observed.         Assessment challenges: None detected.              Reported side-effects: None.        Post-procedural adverse reactions or complications: None reported         Sedation: Please see nurses note. When no sedatives are used, the analgesic levels obtained are directly associated to the effectiveness of the local anesthetics. However, when sedation is provided, the level of analgesia obtained during the initial 1 hour following the intervention, is believed to be the result of a combination of factors. These factors may include, but are not limited to: 1. The effectiveness of the local anesthetics used. 2. The effects of the analgesic(s) and/or anxiolytic(s) used. 3. The degree of discomfort experienced by the patient at the time of the procedure. 4. The patients ability and reliability in recalling and recording the events. 5. The presence and influence of possible secondary gains and/or psychosocial factors. Reported result: Relief experienced during the 1st hour after the procedure: 100 % (Ultra-Short Term Relief)            Interpretative annotation: Clinically appropriate result. Analgesia during this period is likely to be Local Anesthetic and/or IV Sedative (Analgesic/Anxiolytic) related.          Effects of local anesthetic: The analgesic effects attained during this period are directly  associated to the localized infiltration of local anesthetics and therefore cary significant diagnostic value as to the etiological location, or anatomical origin, of the pain. Expected duration of relief is directly dependent on the pharmacodynamics of the local anesthetic used. Long-acting (4-6 hours) anesthetics used.  Reported result: Relief during the next 4 to 6 hour  after the procedure: 100 % (Short-Term Relief)            Interpretative annotation: Clinically appropriate result. Analgesia during this period is likely to be Local Anesthetic-related.          Long-term benefit: Defined as the period of time past the expected duration of local anesthetics (1 hour for short-acting and 4-6 hours for long-acting). With the possible exception of prolonged sympathetic blockade from the local anesthetics, benefits during this period are typically attributed to, or associated with, other factors such as analgesic sensory neuropraxia, antiinflammatory effects, or beneficial biochemical changes provided by agents other than the local anesthetics.  Reported result: Extended relief following procedure: 90 % (Long-Term Relief)            Interpretative annotation: Clinically possible results. Good relief. No permanent benefit expected. Inflammation plays a part in the etiology to the pain.          Current benefits: Defined as reported results that persistent at this point in time.   Analgesia: 75-100 %            Function: [name] reports improvement in function ROM: [name] reports improvement in ROM Interpretative annotation: Ongoing benefit. Therapeutic benefit observed. Effective therapeutic approach.          Interpretation: Results would suggest a successful diagnostic and therapeutic intervention.                  Plan:  Set up procedure as a PRN palliative treatment option for this patient.                Laboratory Chemistry  Inflammation Markers (CRP: Acute Phase) (ESR: Chronic Phase) No results found for: CRP, ESRSEDRATE, LATICACIDVEN                       Rheumatology Markers No results found for: RF, ANA, LABURIC, URICUR, LYMEIGGIGMAB, LYMEABIGMQN, HLAB27                      Renal Function Markers No results found for: BUN, CREATININE, BCR, GFRAA, GFRNONAA                           Hepatic Function Markers No results found for: AST, ALT, ALBUMIN,  ALKPHOS, HCVAB, AMYLASE, LIPASE, AMMONIA                      Electrolytes No results found for: NA, K, CL, CALCIUM, MG, PHOS                      Neuropathy Markers No results found for: VITAMINB12, FOLATE, HGBA1C, HIV                      CNS Tests No results found for: COLORCSF, APPEARCSF, RBCCOUNTCSF, WBCCSF, POLYSCSF, LYMPHSCSF, EOSCSF, PROTEINCSF, GLUCCSF, JCVIRUS, CSFOLI, IGGCSF                      Bone Pathology Markers No results found for: Clay, [id number], [id number], [id number], 25OHVITD1,  25OHVITD2, 25OHVITD3, TESTOFREE, TESTOSTERONE                       Coagulation Parameters No results found for: INR, LABPROT, APTT, PLT, DDIMER                      Cardiovascular Markers No results found for: BNP, CKTOTAL, CKMB, TROPONINI, HGB, HCT                       CA Markers No results found for: CEA, CA125, LABCA2                      Note: Lab results reviewed.  Recent Diagnostic Imaging Results  DG C-Arm 1-60 Min-No Report Fluoroscopy was utilized by the requesting physician.  No radiographic  interpretation.   Complexity Note: Imaging results reviewed. Results shared with Ms. Fielding, using Layman's terms.                         Meds   Current Outpatient Medications:  .  b complex vitamins tablet, Take 1 tablet by mouth daily., Disp: , Rfl:  .  BIOTIN 5000 PO, Take by mouth., Disp: , Rfl:  .  diclofenac sodium (VOLTAREN) 1 % GEL, Apply topically 4 (four) times daily., Disp: , Rfl:  .  fexofenadine (ALLEGRA) 180 MG tablet, Take 180 mg by mouth daily., Disp: , Rfl:  .  ibuprofen (ADVIL,MOTRIN) 800 MG tablet, Take 800 mg by mouth every 8 (eight) hours as needed., Disp: , Rfl:  .  Magnesium 400 MG CAPS, Take by mouth., Disp: , Rfl:  .  multivitamin-iron-minerals-folic acid (CENTRUM) chewable tablet, Chew 1 tablet by mouth daily., Disp: , Rfl:  .  Omega-3 Fatty Acids (FISH OIL) 1200 MG CAPS, Take by mouth., Disp: , Rfl:  .  VITAMIN D, CHOLECALCIFEROL, PO, Take by  mouth., Disp: , Rfl:  .  UNABLE TO FIND, Take by mouth. folic acid/multivit-min/lutein (CENTRUM SILVER ORAL), Disp: , Rfl:   ROS  Constitutional: Denies any fever or chills Gastrointestinal: No reported hemesis, hematochezia, vomiting, or acute GI distress Musculoskeletal: Denies any acute onset joint swelling, redness, loss of ROM, or weakness Neurological: No reported episodes of acute onset apraxia, aphasia, dysarthria, agnosia, amnesia, paralysis, loss of coordination, or loss of consciousness  Allergies  Ms. Bauer is allergic to prednisone; atorvastatin; ezetimibe; ibandronic acid; pravastatin; rosuvastatin; simvastatin; and meloxicam.  PFSH  Drug: Ms. Deakins  reports that she does not use drugs. Alcohol:  reports that she does not drink alcohol. Tobacco:  reports that she has never smoked. She has never used smokeless tobacco. Medical:  has a past medical history of Allergy, Arthritis (2019), Hyperlipidemia, and Osteoporosis. Surgical: Ms. Cornforth  has a past surgical history that includes Tonsillectomy and adenoidectomy; Appendectomy; Foot surgery; and Vascular surgery (Bilateral, 2009). Family: family history includes Cancer in her father.  Constitutional Exam  General appearance: Well nourished, well developed, and well hydrated. In no apparent acute distress Vitals:   04/11/18 1043  BP: (!) 145/75  Pulse: 73  Resp: 18  Temp: 98.4 F (36.9 C)  TempSrc: Oral  SpO2: 99%  Weight: 150 lb (68 kg)  Height: 5' (1.524 m)   BMI Assessment: Estimated body mass index is 29.29 kg/m as calculated from the following:   Height as of this encounter: 5' (1.524 m).   Weight as of this encounter: 150 lb (  68 kg).  BMI interpretation table: BMI level Category Range association with higher incidence of chronic pain  <18 kg/m2 Underweight   18.5-24.9 kg/m2 Ideal body weight   25-29.9 kg/m2 Overweight Increased incidence by 20%  30-34.9 kg/m2 Obese (Class I) Increased incidence by 68%   35-39.9 kg/m2 Severe obesity (Class II) Increased incidence by 136%  >40 kg/m2 Extreme obesity (Class III) Increased incidence by 254%   Patient's current BMI Ideal Body weight  Body mass index is 29.29 kg/m. Ideal body weight: 45.5 kg (100 lb 4.9 oz) Adjusted ideal body weight: 54.5 kg (120 lb 3 oz)   BMI Readings from Last 4 Encounters:  04/11/18 29.29 kg/m  03/13/18 29.29 kg/m  02/12/18 29.29 kg/m  01/16/18 29.29 kg/m   Wt Readings from Last 4 Encounters:  04/11/18 150 lb (68 kg)  03/13/18 150 lb (68 kg)  02/12/18 150 lb (68 kg)  01/16/18 150 lb (68 kg)  Psych/Mental status: Alert, oriented x 3 (person, place, & time)       Eyes: PERLA Respiratory: No evidence of acute respiratory distress  Cervical Spine Area Exam  Skin & Axial Inspection: No masses, redness, edema, swelling, or associated skin lesions Alignment: Symmetrical Functional ROM: Unrestricted ROM      Stability: No instability detected Muscle Tone/Strength: Functionally intact. No obvious neuro-muscular anomalies detected. Sensory (Neurological): Unimpaired Palpation: No palpable anomalies              Upper Extremity (UE) Exam    Side: Right upper extremity  Side: Left upper extremity  Skin & Extremity Inspection: Skin color, temperature, and hair growth are WNL. No peripheral edema or cyanosis. No masses, redness, swelling, asymmetry, or associated skin lesions. No contractures.  Skin & Extremity Inspection: Skin color, temperature, and hair growth are WNL. No peripheral edema or cyanosis. No masses, redness, swelling, asymmetry, or associated skin lesions. No contractures.  Functional ROM: Unrestricted ROM          Functional ROM: Unrestricted ROM          Muscle Tone/Strength: Functionally intact. No obvious neuro-muscular anomalies detected.  Muscle Tone/Strength: Functionally intact. No obvious neuro-muscular anomalies detected.  Sensory (Neurological): Unimpaired          Sensory (Neurological):  Unimpaired          Palpation: No palpable anomalies              Palpation: No palpable anomalies              Provocative Test(s):  Phalen's test: deferred Tinel's test: deferred Apley's scratch test (touch opposite shoulder):  Action 1 (Across chest): deferred Action 2 (Overhead): deferred Action 3 (LB reach): deferred   Provocative Test(s):  Phalen's test: deferred Tinel's test: deferred Apley's scratch test (touch opposite shoulder):  Action 1 (Across chest): deferred Action 2 (Overhead): deferred Action 3 (LB reach): deferred    Thoracic Spine Area Exam  Skin & Axial Inspection: No masses, redness, or swelling Alignment: Symmetrical Functional ROM: Unrestricted ROM Stability: No instability detected Muscle Tone/Strength: Functionally intact. No obvious neuro-muscular anomalies detected. Sensory (Neurological): Unimpaired Muscle strength & Tone: No palpable anomalies  Lumbar Spine Area Exam  Skin & Axial Inspection: No masses, redness, or swelling Alignment: Symmetrical Functional ROM: Improved after treatment       Stability: No instability detected Muscle Tone/Strength: Functionally intact. No obvious neuro-muscular anomalies detected. Sensory (Neurological): Improved Palpation: No palpable anomalies       Provocative Tests: Hyperextension/rotation test: Improved after treatment  Lumbar quadrant test (Kemp's test): Improved after treatment       Lateral bending test: Improved after treatment       Patrick's Maneuver: Improved after treatment                   FABER test: deferred today                   S-I anterior distraction/compression test: deferred today         S-I lateral compression test: deferred today         S-I Thigh-thrust test: deferred today         S-I Gaenslen's test: deferred today          Gait & Posture Assessment  Ambulation: Unassisted Gait: Relatively normal for age and body habitus Posture: WNL   Lower Extremity Exam    Side:  Right lower extremity  Side: Left lower extremity  Stability: No instability observed          Stability: No instability observed          Skin & Extremity Inspection: Skin color, temperature, and hair growth are WNL. No peripheral edema or cyanosis. No masses, redness, swelling, asymmetry, or associated skin lesions. No contractures.  Skin & Extremity Inspection: Skin color, temperature, and hair growth are WNL. No peripheral edema or cyanosis. No masses, redness, swelling, asymmetry, or associated skin lesions. No contractures.  Functional ROM: Unrestricted ROM                  Functional ROM: Unrestricted ROM                  Muscle Tone/Strength: Functionally intact. No obvious neuro-muscular anomalies detected.  Muscle Tone/Strength: Functionally intact. No obvious neuro-muscular anomalies detected.  Sensory (Neurological): Unimpaired  Sensory (Neurological): Unimpaired  Palpation: No palpable anomalies  Palpation: No palpable anomalies   Assessment  Primary Diagnosis & Pertinent Problem List: The primary encounter diagnosis was Lumbar radiculopathy . Diagnoses of Chronic pain syndrome, Lumbar degenerative disc disease, and Compression fracture of L5 lumbar vertebra, sequela were also pertinent to this visit.  Status Diagnosis  Improved Controlled Controlled 1. Lumbar radiculopathy    2. Chronic pain syndrome   3. Lumbar degenerative disc disease   4. Compression fracture of L5 lumbar vertebra, sequela      General Recommendations: The pain condition that the patient suffers from is best treated with a multidisciplinary approach that involves an increase in physical activity to prevent de-conditioning and worsening of the pain cycle, as well as psychological counseling (formal and/or informal) to address the co-morbid psychological affects of pain. Treatment will often involve judicious use of pain medications and interventional procedures to decrease the pain, allowing the patient to  participate in the physical activity that will ultimately produce long-lasting pain reductions. The goal of the multidisciplinary approach is to return the patient to a higher level of overall function and to restore their ability to perform activities of daily living.  67 year old female with a history of lumbar radiculopathy status post lumbar ESI #2 at left L4-L5 who follows up endorsing significant pain relief in regards to her left hip and left leg pain.  Patient states that she is able to perform activities of daily living with greater ease and has improvement in her functional status.  She is pleased with the results.  We will place an as needed order that the patient can utilize for repeat lumbar epidural steroid injection if  and when her symptoms return.  Plan of Care   Lab-work, procedure(s), and/or referral(s): Orders Placed This Encounter  Procedures  . Lumbar Epidural Injection    Time Note: Greater than 50% of the 15 minute(s) of face-to-face time spent with Ms. Meisinger, was spent in counseling/coordination of care regarding: Ms. Chrisman primary cause of pain, the treatment plan, treatment alternatives, the risks and possible complications of proposed treatment, the results, interpretation and significance of  her recent diagnostic interventional treatment(s), realistic expectations and the goals of pain management (increased in functionality).  Provider-requested follow-up: Return if symptoms worsen or fail to improve.  Future Appointments  Date Time Provider [place]  04/23/2018  1:30 PM Gloriajean Dell, RD [facility] None    Primary Care Physician: Adin Hector, MD Location: [facility] Out[facility] Note by: Gillis Santa, M.D Date: 04/11/2018; Time: 11:59 AM  There are no Patient Instructions on file for this visit.

## 2018-04-11 NOTE — Progress Notes (Signed)
Safety precautions to be maintained throughout the outpatient stay will include: orient to surroundings, keep bed in low position, maintain call bell within reach at all times, provide assistance with transfer out of bed and ambulation.  

## 2018-04-12 ENCOUNTER — Ambulatory Visit: Payer: Medicare HMO | Admitting: Dietician

## 2018-04-23 ENCOUNTER — Ambulatory Visit: Payer: Medicare HMO | Admitting: Dietician

## 2018-04-25 ENCOUNTER — Encounter: Payer: Self-pay | Admitting: Dietician

## 2018-11-22 ENCOUNTER — Other Ambulatory Visit: Payer: Self-pay | Admitting: Internal Medicine

## 2018-11-22 DIAGNOSIS — Z1231 Encounter for screening mammogram for malignant neoplasm of breast: Secondary | ICD-10-CM

## 2018-12-13 ENCOUNTER — Ambulatory Visit (INDEPENDENT_AMBULATORY_CARE_PROVIDER_SITE_OTHER): Payer: Medicare HMO

## 2018-12-13 ENCOUNTER — Other Ambulatory Visit: Payer: Self-pay | Admitting: Podiatry

## 2018-12-13 ENCOUNTER — Other Ambulatory Visit: Payer: Self-pay

## 2018-12-13 ENCOUNTER — Ambulatory Visit: Payer: Medicare HMO | Admitting: Podiatry

## 2018-12-13 DIAGNOSIS — M79671 Pain in right foot: Secondary | ICD-10-CM

## 2018-12-13 DIAGNOSIS — S93402A Sprain of unspecified ligament of left ankle, initial encounter: Secondary | ICD-10-CM

## 2018-12-13 DIAGNOSIS — R600 Localized edema: Secondary | ICD-10-CM | POA: Insufficient documentation

## 2018-12-13 DIAGNOSIS — S93491A Sprain of other ligament of right ankle, initial encounter: Secondary | ICD-10-CM

## 2018-12-13 DIAGNOSIS — M25571 Pain in right ankle and joints of right foot: Secondary | ICD-10-CM | POA: Diagnosis not present

## 2018-12-13 DIAGNOSIS — K589 Irritable bowel syndrome without diarrhea: Secondary | ICD-10-CM | POA: Insufficient documentation

## 2018-12-13 DIAGNOSIS — M810 Age-related osteoporosis without current pathological fracture: Secondary | ICD-10-CM | POA: Insufficient documentation

## 2018-12-26 ENCOUNTER — Ambulatory Visit: Payer: Medicare HMO | Admitting: Podiatry

## 2018-12-27 ENCOUNTER — Other Ambulatory Visit: Payer: Self-pay

## 2018-12-27 ENCOUNTER — Encounter: Payer: Self-pay | Admitting: Podiatry

## 2018-12-27 ENCOUNTER — Ambulatory Visit (INDEPENDENT_AMBULATORY_CARE_PROVIDER_SITE_OTHER): Payer: Medicare HMO

## 2018-12-27 ENCOUNTER — Ambulatory Visit: Payer: Medicare HMO

## 2018-12-27 ENCOUNTER — Ambulatory Visit: Payer: Medicare HMO | Admitting: Podiatry

## 2018-12-27 DIAGNOSIS — S93402S Sprain of unspecified ligament of left ankle, sequela: Secondary | ICD-10-CM

## 2018-12-27 DIAGNOSIS — S93602D Unspecified sprain of left foot, subsequent encounter: Secondary | ICD-10-CM

## 2018-12-30 NOTE — Progress Notes (Signed)
   HPI: 68 year old female presenting today for follow up evaluation of a left ankle sprain. She reports some continued tenderness that is exacerbated by applying pressure to the area. She was using a CAM boot but the strap broke so she is now using a post op shoe. Walking without the post op shoe increases the pain. She has been taking Ibuprofen for treatment. Patient is here for further evaluation and treatment.   Past Medical History:  Diagnosis Date  . Allergy   . Arthritis 2019   osteoarthritis, hips bilat  . Hyperlipidemia   . Osteoporosis    right wrist, hips bilat     Physical Exam: General: The patient is alert and oriented x3 in no acute distress.  Dermatology: Skin is warm, dry and supple bilateral lower extremities. Negative for open lesions or macerations.  Vascular: Palpable pedal pulses bilaterally. No erythema noted. Capillary refill within normal limits.  Neurological: Epicritic and protective threshold grossly intact bilaterally.   Musculoskeletal Exam: Pain with palpation noted to the left lateral forefoot. Mild edema noted. Range of motion within normal limits to all pedal and ankle joints bilateral. Muscle strength 5/5 in all groups bilateral.   Radiographic Exam:  Normal osseous mineralization. Joint spaces preserved. No fracture/dislocation/boney destruction.    Assessment: 1. Left lateral forefoot capsulitis secondary to mechanical fall   Plan of Care:  1. Patient evaluated. X-Rays reviewed.  2. Continue using post op shoe.  3. Continue taking Motrin 800 mg daily.  4. Declined injection.  5. Return to clinic in 4 weeks.      Edrick Kins, DPM [facility]  Dr. Edrick Kins, DPM    [address], [place] [address]                Office [phone]  Fax [phone]

## 2018-12-31 NOTE — Progress Notes (Signed)
Subjective:  Patient ID: Rebekah Shaw, female    DOB: 04/16/1951,  MRN: [id number]  Chief Complaint  Patient presents with  . Ankle Pain    Pt states twisted ankle today at 12:00. Pt states twisted ankle laterally and pain is prominent in lateral ankle, although pt states pain and bruising at dorsal forefoot as well.    68 y.o. female presents with the above complaint. Hx as above.   Review of Systems: Negative except as noted in the HPI. Denies N/V/F/Ch.  Past Medical History:  Diagnosis Date  . Allergy   . Arthritis 2019   osteoarthritis, hips bilat  . Hyperlipidemia   . Osteoporosis    right wrist, hips bilat    Current Outpatient Medications:  .  b complex vitamins tablet, Take 1 tablet by mouth daily., Disp: , Rfl:  .  BIOTIN 5000 PO, Take by mouth., Disp: , Rfl:  .  ciprofloxacin (CIPRO) 500 MG tablet, , Disp: , Rfl:  .  diclofenac sodium (VOLTAREN) 1 % GEL, Apply topically 4 (four) times daily., Disp: , Rfl:  .  fexofenadine (ALLEGRA) 180 MG tablet, Take 180 mg by mouth daily., Disp: , Rfl:  .  hydrochlorothiazide (HYDRODIURIL) 12.5 MG tablet, Take by mouth., Disp: , Rfl:  .  ibuprofen (ADVIL,MOTRIN) 800 MG tablet, Take 800 mg by mouth every 8 (eight) hours as needed., Disp: , Rfl:  .  Magnesium 400 MG CAPS, Take by mouth., Disp: , Rfl:  .  magnesium oxide (MAG-OX) 400 MG tablet, Take by mouth., Disp: , Rfl:  .  multivitamin-iron-minerals-folic acid (CENTRUM) chewable tablet, Chew 1 tablet by mouth daily., Disp: , Rfl:  .  Omega-3 Fatty Acids (FISH OIL) 1200 MG CAPS, Take by mouth., Disp: , Rfl:  .  UNABLE TO FIND, Take by mouth. folic acid/multivit-min/lutein (CENTRUM SILVER ORAL), Disp: , Rfl:  .  VITAMIN D, CHOLECALCIFEROL, PO, Take by mouth., Disp: , Rfl:   Social History   Tobacco Use  Smoking Status Never Smoker  Smokeless Tobacco Never Used    Allergies  Allergen Reactions  . Prednisone Diarrhea and Nausea And Vomiting  . Atorvastatin Other (See  Comments)  . Ezetimibe Other (See Comments)    intolerant  . Ibandronic Acid Other (See Comments)    dyspepsia  . Pravastatin Other (See Comments)  . Rosuvastatin Other (See Comments)    May have caused abdominal pain  . Simvastatin Other (See Comments)  . Meloxicam Rash and Swelling    RASH AND LEG SWELLING   Objective:  There were no vitals filed for this visit. There is no height or weight on file to calculate BMI. Constitutional Well developed. Well nourished.  Vascular Dorsalis pedis pulses palpable bilaterally. Posterior tibial pulses palpable bilaterally. Capillary refill normal to all digits.  No cyanosis or clubbing noted. Pedal hair growth normal.  Neurologic Normal speech. Oriented to person, place, and time. Epicritic sensation to light touch grossly present bilaterally.  Dermatologic Nails well groomed and normal in appearance. No open wounds. No skin lesions.  Orthopedic: POP right ATFL. Negative anterior drawer. No pain at either malleolus. No pain at deltoids.   Radiographs: Taken and reviewed no acute fractures Assessment:   1. Sprain of anterior talofibular ligament of right ankle, initial encounter   2. Acute right ankle pain    Plan:  Patient was evaluated and treated and all questions answered.  Left Ankle Sprain -XR reviewed as above -Dispense Trilock brace -Educated on stretching and icing  Return in  about 2 weeks (around 12/27/2018) for Left ankle sprain f/u .

## 2019-01-08 ENCOUNTER — Ambulatory Visit
Admission: RE | Admit: 2019-01-08 | Discharge: 2019-01-08 | Disposition: A | Discharge status: Home / Self Care | Payer: Medicare HMO | Source: Ambulatory Visit | Attending: Internal Medicine | Admitting: Internal Medicine

## 2019-01-08 ENCOUNTER — Other Ambulatory Visit: Payer: Self-pay

## 2019-01-08 DIAGNOSIS — Z1231 Encounter for screening mammogram for malignant neoplasm of breast: Secondary | ICD-10-CM | POA: Insufficient documentation

## 2019-01-24 ENCOUNTER — Ambulatory Visit: Payer: Medicare HMO | Admitting: Podiatry

## 2019-04-09 ENCOUNTER — Other Ambulatory Visit: Payer: Self-pay | Admitting: Student in an Organized Health Care Education/Training Program

## 2019-04-09 DIAGNOSIS — M5416 Radiculopathy, lumbar region: Secondary | ICD-10-CM

## 2019-06-11 DIAGNOSIS — I10 Essential (primary) hypertension: Secondary | ICD-10-CM | POA: Insufficient documentation

## 2019-07-23 DIAGNOSIS — R3129 Other microscopic hematuria: Secondary | ICD-10-CM | POA: Insufficient documentation

## 2019-07-24 ENCOUNTER — Other Ambulatory Visit: Payer: Self-pay | Admitting: Internal Medicine

## 2019-07-24 DIAGNOSIS — R3121 Asymptomatic microscopic hematuria: Secondary | ICD-10-CM

## 2019-07-28 ENCOUNTER — Ambulatory Visit
Admission: RE | Admit: 2019-07-28 | Discharge: 2019-07-28 | Disposition: A | Discharge status: Home / Self Care | Payer: Medicare HMO | Source: Ambulatory Visit | Attending: Internal Medicine | Admitting: Internal Medicine

## 2019-07-28 ENCOUNTER — Other Ambulatory Visit: Payer: Self-pay

## 2019-07-28 DIAGNOSIS — R3121 Asymptomatic microscopic hematuria: Secondary | ICD-10-CM

## 2019-10-22 ENCOUNTER — Encounter: Payer: Self-pay | Admitting: Obstetrics and Gynecology

## 2019-12-16 ENCOUNTER — Encounter: Payer: Self-pay | Admitting: Student in an Organized Health Care Education/Training Program

## 2019-12-16 ENCOUNTER — Ambulatory Visit
Payer: Medicare HMO | Attending: Student in an Organized Health Care Education/Training Program | Admitting: Student in an Organized Health Care Education/Training Program

## 2019-12-16 ENCOUNTER — Other Ambulatory Visit: Payer: Self-pay

## 2019-12-16 VITALS — BP 142/58 | HR 82 | Temp 98.7°F | Resp 16 | Ht 60.0 in | Wt 155.0 lb

## 2019-12-16 DIAGNOSIS — G894 Chronic pain syndrome: Secondary | ICD-10-CM | POA: Insufficient documentation

## 2019-12-16 DIAGNOSIS — M5416 Radiculopathy, lumbar region: Secondary | ICD-10-CM | POA: Diagnosis present

## 2019-12-16 NOTE — Progress Notes (Signed)
Safety precautions to be maintained throughout the outpatient stay will include: orient to surroundings, keep bed in low position, maintain call bell within reach at all times, provide assistance with transfer out of bed and ambulation.  

## 2019-12-16 NOTE — Patient Instructions (Signed)
Epidural Steroid Injection Patient Information  Description: The epidural space surrounds the nerves as they exit the spinal cord.  In some patients, the nerves can be compressed and inflamed by a bulging disc or a tight spinal canal (spinal stenosis).  By injecting steroids into the epidural space, we can bring irritated nerves into direct contact with a potentially helpful medication.  These steroids act directly on the irritated nerves and can reduce swelling and inflammation which often leads to decreased pain.  Epidural steroids may be injected anywhere along the spine and from the neck to the low back depending upon the location of your pain.   After numbing the skin with local anesthetic (like Novocaine), a small needle is passed into the epidural space slowly.  You may experience a sensation of pressure while this is being done.  The entire block usually last less than 10 minutes.  Conditions which may be treated by epidural steroids:   Low back and leg pain  Neck and arm pain  Spinal stenosis  Post-laminectomy syndrome  Herpes zoster (shingles) pain  Pain from compression fractures  Preparation for the injection:  1. Do not eat any solid food or dairy products within 8 hours of your appointment.  2. You may drink clear liquids up to 3 hours before appointment.  Clear liquids include water, black coffee, juice or soda.  No milk or cream please. 3. You may take your regular medication, including pain medications, with a sip of water before your appointment  Diabetics should hold regular insulin (if taken separately) and take 1/2 normal NPH dos the morning of the procedure.  Carry some sugar containing items with you to your appointment. 4. A driver must accompany you and be prepared to drive you home after your procedure.  5. Bring all your current medications with your. 6. An IV may be inserted and sedation may be given at the discretion of the physician.   7. A blood pressure  cuff, EKG and other monitors will often be applied during the procedure.  Some patients may need to have extra oxygen administered for a short period. 8. You will be asked to provide medical information, including your allergies, prior to the procedure.  We must know immediately if you are taking blood thinners (like Coumadin/Warfarin)  Or if you are allergic to IV iodine contrast (dye). We must know if you could possible be pregnant.  Possible side-effects:  Bleeding from needle site  Infection (rare, may require surgery)  Nerve injury (rare)  Numbness & tingling (temporary)  Difficulty urinating (rare, temporary)  Spinal headache ( a headache worse with upright posture)  Light -headedness (temporary)  Pain at injection site (several days)  Decreased blood pressure (temporary)  Weakness in arm/leg (temporary)  Pressure sensation in back/neck (temporary)  Call if you experience:  Fever/chills associated with headache or increased back/neck pain.  Headache worsened by an upright position.  New onset weakness or numbness of an extremity below the injection site  Hives or difficulty breathing (go to the emergency room)  Inflammation or drainage at the infection site  Severe back/neck pain  Any new symptoms which are concerning to you  Please note:  Although the local anesthetic injected can often make your back or neck feel good for several hours after the injection, the pain will likely return.  It takes 3-7 days for steroids to work in the epidural space.  You may not notice any pain relief for at least that one week.    If effective, we will often do a series of three injections spaced 3-6 weeks apart to maximally decrease your pain.  After the initial series, we generally will wait several months before considering a repeat injection of the same type.  If you have any questions, please call [phone] [facility]  ____________________________________________________________________________________________  Preparing for your procedure (without sedation)  Procedure appointments are limited to planned procedures: . No Prescription Refills. . No disability issues will be discussed. . No medication changes will be discussed.  Instructions: . Oral Intake: Do not eat or drink anything for at least 6 hours prior to your procedure. (Exception: Blood Pressure Medication. See below.) . Transportation: Unless otherwise stated by your physician, you may drive yourself after the procedure. . Blood Pressure Medicine: Do not forget to take your blood pressure medicine with a sip of water the morning of the procedure. If your Diastolic (lower reading)is above 100 mmHg, elective cases will be cancelled/rescheduled. . Blood thinners: These will need to be stopped for procedures. Notify our staff if you are taking any blood thinners. Depending on which one you take, there will be specific instructions on how and when to stop it. . Diabetics on insulin: Notify the staff so that you can be scheduled 1st case in the morning. If your diabetes requires high dose insulin, take only  of your normal insulin dose the morning of the procedure and notify the staff that you have done so. . Preventing infections: Shower with an antibacterial soap the morning of your procedure.  . Build-up your immune system: Take 1000 mg of Vitamin C with every meal (3 times a day) the day prior to your procedure. . Antibiotics: Inform the staff if you have a condition or reason that requires you to take antibiotics before dental procedures. . Pregnancy: If you are pregnant, call and cancel the procedure. . Sickness: If you have a cold, fever, or any active infections, call and cancel the procedure. . Arrival: You must be in the facility at least 30 minutes prior to your scheduled procedure. . Children: Do not bring any children with  you. . Dress appropriately: Bring dark clothing that you would not mind if they get stained. . Valuables: Do not bring any jewelry or valuables.  Reasons to call and reschedule or cancel your procedure: (Following these recommendations will minimize the risk of a serious complication.) . Surgeries: Avoid having procedures within 2 weeks of any surgery. (Avoid for 2 weeks before or after any surgery). . Flu Shots: Avoid having procedures within 2 weeks of a flu shots or . (Avoid for 2 weeks before or after immunizations). . Barium: Avoid having a procedure within 7-10 days after having had a radiological study involving the use of radiological contrast. (Myelograms, Barium swallow or enema study). . Heart attacks: Avoid any elective procedures or surgeries for the initial 6 months after a "Myocardial Infarction" (Heart Attack). . Blood thinners: It is imperative that you stop these medications before procedures. Let us know if you if you take any blood thinner.  . Infection: Avoid procedures during or within two weeks of an infection (including chest colds or gastrointestinal problems). Symptoms associated with infections include: Localized redness, fever, chills, night sweats or profuse sweating, burning sensation when voiding, cough, congestion, stuffiness, runny nose, sore throat, diarrhea, nausea, vomiting, cold or Flu symptoms, recent or current infections. It is specially important if the infection is over the area that we intend to treat. .   Heart and lung problems: Symptoms that may suggest an active cardiopulmonary problem include: cough, chest pain, breathing difficulties or shortness of breath, dizziness, ankle swelling, uncontrolled high or unusually low blood pressure, and/or palpitations. If you are experiencing any of these symptoms, cancel your procedure and contact your primary care physician for an evaluation.  Remember:  Regular Business hours are:  Monday to Thursday 8:00 AM to 4:00  PM  Provider's Schedule: Francisco Naveira, MD:  Procedure days: Tuesday and Thursday 7:30 AM to 4:00 PM  Bilal Lateef, MD:  Procedure days: Monday and Wednesday 7:30 AM to 4:00 PM ____________________________________________________________________________________________   

## 2019-12-16 NOTE — Progress Notes (Signed)
PROVIDER NOTE: Information contained herein reflects review and annotations entered in association with encounter. Interpretation of such information and data should be left to medically-trained personnel. Information provided to patient can be located elsewhere in the medical record under "Patient Instructions". Document created using STT-dictation technology, any transcriptional errors that may result from process are unintentional.  °  °Patient: Rebekah Shaw  Service Category: E/M  Provider: Bilal Lateef, MD  °DOB: 10/12/1950  DOS: 12/16/2019  Specialty: Interventional Pain Management  °MRN: [id number]  Setting: Ambulatory outpatient  PCP: Klein, Bert J III, MD  °Type: Established Patient    Referring Provider: Klein, Bert J III, MD  °Location: Office  Delivery: Face-to-face    ° °HPI  °Reason for encounter: Rebekah Shaw, a 69 y.o. year old female, is here today for evaluation and management of her Lumbar radiculopathy [M54.16]. Ms. Musto's primary complain today is Follow-up °Last encounter: Practice (Visit date not found). My last encounter with her was on Visit date not found. °Pertinent problems: Rebekah Shaw has Lumbar radiculopathy (Right L4/5); Lumbar spondylosis; Lumbar facet arthropathy; Lumbar degenerative disc disease; Compression fracture of L5 lumbar vertebra, sequela; and Chronic pain syndrome on their pertinent problem list. °Pain Assessment: Severity of Chronic pain is reported as a 6 /10. Location: Back Left, Mid, Upper/from upper left back to left hip, knee and down to left ankle and foot.. Onset: More than a month ago. Quality: Constant, Throbbing, Numbness (swelling). Timing: Constant. Modifying factor(s): Medications, tylenol, ibuprofen. °Vitals:  height is 5' (1.524 m) and weight is 155 lb (70.3 kg). Her temperature is 98.7 °F (37.1 °C). Her blood pressure is 142/58 (abnormal) and her pulse is 82. Her respiration is 16 and oxygen saturation is 99%.  ° °Deborah is a very pleasant 69-year-old  female who presents for worsening low back pain that radiates into her right lower extremity.  Patient's last visit with me was back in October 2019.  Since then she is done fairly well in regards to her sciatica related pain.  Of note in May 2020 she did sustain an injury at her house resulting in left hip pain, left ankle swelling and subsequent lymphedema.  She did see Dr. Norman Regal for this.  She had ankle wraps and was placed in a boot.  This is since resolved.  Given increase in right lower extremity pain, radiating, secondary to lumbar radiculopathy, discussed repeating lumbar epidural steroid injection at right L4-L5.  Risks and benefits reviewed and patient would like to proceed.  We will plan on doing this without sedation as we did back in September 2019. ° °ROS  °Constitutional: Denies any fever or chills °Gastrointestinal: No reported hemesis, hematochezia, vomiting, or acute GI distress °Musculoskeletal: Denies any acute onset joint swelling, redness, loss of ROM, or weakness °Neurological: No reported episodes of acute onset apraxia, aphasia, dysarthria, agnosia, amnesia, paralysis, loss of coordination, or loss of consciousness ° °Medication Review  °Biotin, Cholecalciferol, Fish Oil, Magnesium, Pitavastatin Calcium, UNABLE TO FIND, b complex vitamins, ciprofloxacin, diclofenac sodium, diphenhydrAMINE-APAP (sleep), fexofenadine, hydrochlorothiazide, ibuprofen, magnesium oxide, and multivitamin-iron-minerals-folic acid ° °History Review  °Allergy: Rebekah Shaw is allergic to prednisone, atorvastatin, ezetimibe, ibandronic acid, pravastatin, rosuvastatin, simvastatin, and meloxicam. °Drug: Rebekah Shaw  reports no history of drug use. °Alcohol:  reports no history of alcohol use. °Tobacco:  reports that she has never smoked. She has never used smokeless tobacco. °Social: Rebekah Shaw  reports that she has never smoked. She has never used smokeless tobacco. She reports that she   does not drink alcohol and does  not use drugs. Medical:  has a past medical history of Allergy, Arthritis (2019), Hyperlipidemia, and Osteoporosis. Surgical: Rebekah Shaw  has a past surgical history that includes Tonsillectomy and adenoidectomy; Appendectomy; Foot surgery; and Vascular surgery (Bilateral, 2009). Family: family history includes Cancer in her father.  Laboratory Chemistry Profile   Renal No results found for: BUN, CREATININE, LABCREA, BCR, GFR, GFRAA, GFRNONAA, LABVMA, EPIRU, EPINEPH24HUR, NOREPRU, NOREPI24HUR, DOPARU, OYDXA12INOM   Hepatic No results found for: AST, ALT, ALBUMIN, ALKPHOS, HCVAB, AMYLASE, LIPASE, AMMONIA   Electrolytes No results found for: NA, K, CL, CALCIUM, MG, PHOS   Bone No results found for: VD25OH, [id number], GE3662HU7, ML4650PT4, 25OHVITD1, 25OHVITD2, 25OHVITD3, TESTOFREE, TESTOSTERONE   Inflammation (CRP: Acute Phase) (ESR: Chronic Phase) No results found for: CRP, ESRSEDRATE, LATICACIDVEN     Note: Above Lab results reviewed.  Recent Imaging Review  CT RENAL STONE STUDY CLINICAL DATA:  Asymptomatic microscopic hematuria.  EXAM: CT ABDOMEN AND PELVIS WITHOUT CONTRAST  TECHNIQUE: Multidetector CT imaging of the abdomen and pelvis was performed following the standard protocol without IV contrast.  COMPARISON:  04/18/2012  FINDINGS: The lack of intravenous contrast limits the ability to evaluate solid abdominal organs.  Lower chest: Limited visualization of the lower thorax demonstrates minimal subsegmental atelectasis involving the caudal aspect of the lingula adjacent to the anterior aspect of the left fissure. No discrete focal airspace opacities. No pleural effusion.  Normal heart size.  No pericardial effusion.  Hepatobiliary: Normal hepatic contour. Normal noncontrast appearance of the gallbladder given degree distention. No radiopaque gallstones. No ascites.  Pancreas: Normal noncontrast appearance of the pancreas.  Spleen: Normal noncontrast appearance  of the spleen.  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 This finding is associated with exaggerated external rotation of the bilateral inferior pole moieties. No renal stones. No renal stones are seen along the expected course of either ureter or the urinary bladder. Normal noncontrast appearance of the urinary bladder given degree of distention. No urine obstruction or perinephric stranding.  Normal noncontrast appearance the bilateral adrenal glands.  Stomach/Bowel: Large stool burden, particularly within the sigmoid colon and rectal vault. Normal noncontrast appearance of the terminal ileum. The appendix is not visualized compatible with provided operative history. No evidence of enteric obstruction. No pneumoperitoneum, pneumatosis or portal venous gas.  Vascular/Lymphatic: Scattered atherosclerotic plaque within normal caliber abdominal aorta.  No bulky retroperitoneal, mesenteric, pelvic or inguinal lymphadenopathy on this noncontrast examination.  Reproductive: Dystrophic calcifications within the uterus may represent degenerating fibroids. No discrete adnexal lesion on this noncontrast examination. No free fluid in the pelvic cul-de-sac.  Other: There is a minimal amount of subcutaneous edema about the midline of the low back.  Musculoskeletal: Mottled lucencies involving the bilateral sacral ala (representative image 54, series 2), could represent sacral insufficiency fractures.  IMPRESSION: 1. No evidence of nephrolithiasis or definitive explanation for patient's microscopic hematuria. Specifically, no evidence of nephrolithiasis. 2. 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 No evidence of urinary obstruction. 3. Mottled  lucencies involving the bilateral sacral ala are nonspecific though could represent sacral insufficiency fractures. Correlation for pelvic pain is advised. Further evaluation with pelvic MRI could performed as indicated. 4. Large colonic stool burden without evidence of enteric obstruction. 5.  Aortic Atherosclerosis (ICD10-I70.0).  Electronically Signed   By: Sandi Mariscal M.D.   On: 07/28/2019 10:02 Note:  Reviewed       ° °Physical Exam  °General appearance: Well nourished, well developed, and well hydrated. In no apparent acute distress °Mental status: Alert, oriented x 3 (person, place, & time)       °Respiratory: No evidence of acute respiratory distress °Eyes: PERLA °Vitals: BP (!) 142/58    Pulse 82    Temp 98.7 °F (37.1 °C)    Resp 16    Ht 5' (1.524 m)    Wt 155 lb (70.3 kg)    SpO2 99%    BMI 30.27 kg/m²  °BMI: Estimated body mass index is 30.27 kg/m² as calculated from the following: °  Height as of this encounter: 5' (1.524 m). °  Weight as of this encounter: 155 lb (70.3 kg). °Ideal: Ideal body weight: 45.5 kg (100 lb 4.9 oz) °Adjusted ideal body weight: 55.4 kg (122 lb 3 oz) ° °Lumbar Spine Area Exam  °Skin & Axial Inspection: No masses, redness, or swelling °Alignment: Symmetrical °Functional ROM: Decreased ROM affecting primarily the right °Stability: No instability detected °Muscle Tone/Strength: Functionally intact. No obvious neuro-muscular anomalies detected. °Sensory (Neurological): Dermatomal pain pattern (R>L) °Palpation: No palpable anomalies       °Provocative Tests: °Hyperextension/rotation test: deferred today       °Lumbar quadrant test (Kemp's test): (+) on the right for foraminal stenosis °Lateral bending test: (+) ipsilateral radicular pain, on the right. Positive for right-sided foraminal stenosis. °Patrick's Maneuver: deferred today                   °FABER* test: deferred today                   °S-I anterior distraction/compression test: deferred today         °S-I lateral  compression test: deferred today         °S-I Thigh-thrust test: deferred today         °S-I Gaenslen's test: deferred today         °*(Flexion, ABduction and External Rotation) °Gait & Posture Assessment  °Ambulation: Unassisted °Gait: Relatively normal for age and body habitus °Posture: WNL  °Lower Extremity Exam    °Side: Right lower extremity  Side: Left lower extremity  °Stability: No instability observed          Stability: No instability observed          °Skin & Extremity Inspection: Skin color, temperature, and hair growth are WNL. No peripheral edema or cyanosis. No masses, redness, swelling, asymmetry, or associated skin lesions. No contractures.  Skin & Extremity Inspection: Skin color, temperature, and hair growth are WNL. No peripheral edema or cyanosis. No masses, redness, swelling, asymmetry, or associated skin lesions. No contractures.  °Functional ROM: Unrestricted ROM         °         Functional ROM: Pain restricted ROM for hip and knee joints °         °Muscle Tone/Strength: Functionally intact. No obvious neuro-muscular anomalies detected.  Muscle Tone/Strength: Functionally intact. No obvious neuro-muscular anomalies detected.  °Sensory (Neurological): Unimpaired        Sensory (Neurological): Dermatomal pain pattern        °DTR: °Patellar: deferred today °Achilles: deferred today °Plantar: deferred today  DTR: °Patellar: deferred today °Achilles: deferred today °Plantar: deferred today  °Palpation: No palpable anomalies  Palpation: No palpable anomalies  ° ° °Assessment  ° °Status Diagnosis  °Having a Flare-up °Controlled ° 1. Lumbar radiculopathy    °  2. Chronic pain syndrome      Updated Problems: Problem  Lumbar radiculopathy (Right L4/5)  Lumbar Spondylosis  Lumbar Facet Arthropathy  Lumbar Degenerative Disc Disease  Compression Fracture of L5 Lumbar Vertebra, Sequela  Chronic Pain Syndrome    Plan of Care  Ms. TAELER WINNING has a current medication list which includes the  following long-term medication(s): tylenol pm extra strength, fexofenadine, hydrochlorothiazide, and livalo.  Lumbar radicular pain flare: previously done 03/2019, helped for approximately 75% pain relief for >3 months. Return of pain now, recommend repeat lumbar epidural steroid injection  Orders:  Orders Placed This Encounter  Procedures   Lumbar Epidural Injection    Standing Status:   Future    Standing Expiration Date:   01/15/2020    Scheduling Instructions:     Procedure: Interlaminar Lumbar Epidural Steroid injection (LESI)       LEFT L4-5     Laterality: LEFT     Sedation: without     Timeframe: ASAA    Order Specific Question:   Where will this procedure be performed?    Answer:   [facility]   Follow-up plan:   Return in about 5 weeks (around 01/19/2020) for L4/5 ESI, without sedation.   Recent Visits No visits were found meeting these conditions. Showing recent visits within past 90 days and meeting all other requirements Today's Visits Date Type Provider Dept  12/16/19 Office Visit Gillis Santa, MD [facility]  Showing today's visits and meeting all other requirements Future Appointments Date Type Provider Dept  01/19/20 Appointment Gillis Santa, MD [facility]  Showing future appointments within next 90 days and meeting all other requirements  I discussed the assessment and treatment plan with the patient. The patient was provided an opportunity to ask questions and all were answered. The patient agreed with the plan and demonstrated an understanding of the instructions.  Patient advised to call back or seek an in-person evaluation if the symptoms or condition worsens.  Duration of encounter:23mnutes.  Note by: BGillis Santa MD Date: 12/16/2019; Time: 2:14 PM

## 2019-12-22 ENCOUNTER — Other Ambulatory Visit: Payer: Self-pay | Admitting: Internal Medicine

## 2019-12-22 DIAGNOSIS — Z1231 Encounter for screening mammogram for malignant neoplasm of breast: Secondary | ICD-10-CM

## 2020-01-12 ENCOUNTER — Ambulatory Visit
Admission: RE | Admit: 2020-01-12 | Discharge: 2020-01-12 | Disposition: A | Discharge status: Home / Self Care | Payer: Medicare HMO | Source: Ambulatory Visit | Attending: Internal Medicine | Admitting: Internal Medicine

## 2020-01-12 DIAGNOSIS — Z1231 Encounter for screening mammogram for malignant neoplasm of breast: Secondary | ICD-10-CM | POA: Insufficient documentation

## 2020-01-16 ENCOUNTER — Other Ambulatory Visit: Payer: Self-pay | Admitting: Internal Medicine

## 2020-01-16 DIAGNOSIS — R928 Other abnormal and inconclusive findings on diagnostic imaging of breast: Secondary | ICD-10-CM

## 2020-01-16 DIAGNOSIS — N6320 Unspecified lump in the left breast, unspecified quadrant: Secondary | ICD-10-CM

## 2020-01-19 ENCOUNTER — Ambulatory Visit
Admission: RE | Admit: 2020-01-19 | Discharge: 2020-01-19 | Disposition: A | Discharge status: Home / Self Care | Payer: Medicare HMO | Source: Ambulatory Visit | Attending: Student in an Organized Health Care Education/Training Program | Admitting: Student in an Organized Health Care Education/Training Program

## 2020-01-19 ENCOUNTER — Other Ambulatory Visit: Payer: Self-pay

## 2020-01-19 ENCOUNTER — Encounter: Payer: Self-pay | Admitting: Student in an Organized Health Care Education/Training Program

## 2020-01-19 ENCOUNTER — Ambulatory Visit (HOSPITAL_BASED_OUTPATIENT_CLINIC_OR_DEPARTMENT_OTHER): Payer: Medicare HMO | Admitting: Student in an Organized Health Care Education/Training Program

## 2020-01-19 VITALS — BP 152/80 | HR 81 | Temp 97.2°F | Resp 16 | Ht 60.0 in | Wt 152.0 lb

## 2020-01-19 DIAGNOSIS — M5416 Radiculopathy, lumbar region: Secondary | ICD-10-CM | POA: Insufficient documentation

## 2020-01-19 DIAGNOSIS — G894 Chronic pain syndrome: Secondary | ICD-10-CM

## 2020-01-19 MED ORDER — LIDOCAINE HCL 2 % IJ SOLN
INTRAMUSCULAR | Status: AC
  Filled 2020-01-19: qty 20

## 2020-01-19 MED ORDER — DEXAMETHASONE SODIUM PHOSPHATE 10 MG/ML IJ SOLN
10.0000 mg | Freq: Once | INTRAMUSCULAR | Status: AC
  Administered 2020-01-19: 10 mg

## 2020-01-19 MED ORDER — ROPIVACAINE HCL 2 MG/ML IJ SOLN
4.0000 mL | Freq: Once | INTRAMUSCULAR | Status: AC
  Administered 2020-01-19: 4 mL via EPIDURAL

## 2020-01-19 MED ORDER — ROPIVACAINE HCL 2 MG/ML IJ SOLN
INTRAMUSCULAR | Status: AC
  Filled 2020-01-19: qty 10

## 2020-01-19 MED ORDER — SODIUM CHLORIDE 0.9% FLUSH
4.0000 mL | Freq: Once | INTRAVENOUS | Status: AC
  Administered 2020-01-19: 4 mL

## 2020-01-19 MED ORDER — IOHEXOL 180 MG/ML  SOLN
10.0000 mL | Freq: Once | INTRAMUSCULAR | Status: AC
  Administered 2020-01-19: 10 mL via EPIDURAL

## 2020-01-19 MED ORDER — DEXAMETHASONE SODIUM PHOSPHATE 10 MG/ML IJ SOLN
INTRAMUSCULAR | Status: AC
  Filled 2020-01-19: qty 1

## 2020-01-19 MED ORDER — LIDOCAINE HCL 2 % IJ SOLN
20.0000 mL | Freq: Once | INTRAMUSCULAR | Status: AC
  Administered 2020-01-19: 400 mg

## 2020-01-19 MED ORDER — SODIUM CHLORIDE (PF) 0.9 % IJ SOLN
INTRAMUSCULAR | Status: AC
  Filled 2020-01-19: qty 10

## 2020-01-19 NOTE — Patient Instructions (Signed)
Pain Management Discharge Instructions  General Discharge Instructions :  If you need to reach your doctor call: Monday-Friday 8:00 am - 4:00 pm at [phone] or toll free [phone].  After clinic hours [phone] to have operator reach doctor.  Bring all of your medication bottles to all your appointments in the pain clinic.  To cancel or reschedule your appointment with Pain Management please remember to call 24 hours in advance to avoid a fee.  Refer to the educational materials which you have been given on: General Risks, I had my Procedure. Discharge Instructions, Post Sedation.  Post Procedure Instructions:  The drugs you were given will stay in your system until tomorrow, so for the next 24 hours you should not drive, make any legal decisions or drink any alcoholic beverages.  You may eat anything you prefer, but it is better to start with liquids then soups and crackers, and gradually work up to solid foods.  Please notify your doctor immediately if you have any unusual bleeding, trouble breathing or pain that is not related to your normal pain.  Depending on the type of procedure that was done, some parts of your body may feel week and/or numb.  This usually clears up by tonight or the next day.  Walk with the use of an assistive device or accompanied by an adult for the 24 hours.  You may use ice on the affected area for the first 24 hours.  Put ice in a Ziploc bag and cover with a towel and place against area 15 minutes on 15 minutes off.  You may switch to heat after 24 hours.Epidural Steroid Injection Patient Information  Description: The epidural space surrounds the nerves as they exit the spinal cord.  In some patients, the nerves can be compressed and inflamed by a bulging disc or a tight spinal canal (spinal stenosis).  By injecting steroids into the epidural space, we can bring irritated nerves into direct contact with a potentially helpful medication.  These  steroids act directly on the irritated nerves and can reduce swelling and inflammation which often leads to decreased pain.  Epidural steroids may be injected anywhere along the spine and from the neck to the low back depending upon the location of your pain.   After numbing the skin with local anesthetic (like Novocaine), a small needle is passed into the epidural space slowly.  You may experience a sensation of pressure while this is being done.  The entire block usually last less than 10 minutes.  Conditions which may be treated by epidural steroids:   Low back and leg pain  Neck and arm pain  Spinal stenosis  Post-laminectomy syndrome  Herpes zoster (shingles) pain  Pain from compression fractures  Preparation for the injection:  1. Do not eat any solid food or dairy products within 8 hours of your appointment.  2. You may drink clear liquids up to 3 hours before appointment.  Clear liquids include water, black coffee, juice or soda.  No milk or cream please. 3. You may take your regular medication, including pain medications, with a sip of water before your appointment  Diabetics should hold regular insulin (if taken separately) and take 1/2 normal NPH dos the morning of the procedure.  Carry some sugar containing items with you to your appointment. 4. A driver must accompany you and be prepared to drive you home after your procedure.  5. Bring all your current medications with your. 6. An IV may be inserted and   sedation may be given at the discretion of the physician.   7. A blood pressure cuff, EKG and other monitors will often be applied during the procedure.  Some patients may need to have extra oxygen administered for a short period. 8. You will be asked to provide medical information, including your allergies, prior to the procedure.  We must know immediately if you are taking blood thinners (like Coumadin/Warfarin)  Or if you are allergic to IV iodine contrast (dye). We must  know if you could possible be pregnant.  Possible side-effects:  Bleeding from needle site  Infection (rare, may require surgery)  Nerve injury (rare)  Numbness & tingling (temporary)  Difficulty urinating (rare, temporary)  Spinal headache ( a headache worse with upright posture)  Light -headedness (temporary)  Pain at injection site (several days)  Decreased blood pressure (temporary)  Weakness in arm/leg (temporary)  Pressure sensation in back/neck (temporary)  Call if you experience:  Fever/chills associated with headache or increased back/neck pain.  Headache worsened by an upright position.  New onset weakness or numbness of an extremity below the injection site  Hives or difficulty breathing (go to the emergency room)  Inflammation or drainage at the infection site  Severe back/neck pain  Any new symptoms which are concerning to you  Please note:  Although the local anesthetic injected can often make your back or neck feel good for several hours after the injection, the pain will likely return.  It takes 3-7 days for steroids to work in the epidural space.  You may not notice any pain relief for at least that one week.  If effective, we will often do a series of three injections spaced 3-6 weeks apart to maximally decrease your pain.  After the initial series, we generally will wait several months before considering a repeat injection of the same type.  If you have any questions, please call [phone] [facility] 

## 2020-01-19 NOTE — Progress Notes (Signed)
Safety precautions to be maintained throughout the outpatient stay will include: orient to surroundings, keep bed in low position, maintain call bell within reach at all times, provide assistance with transfer out of bed and ambulation.  

## 2020-01-19 NOTE — Progress Notes (Signed)
Patient's Name: Rebekah Shaw  MRN: [id number]  Referring Provider: Lynnea Ferrier, MD  DOB: May 20, 1951  PCP: Lynnea Ferrier, MD  DOS: 01/19/2020  Note by: Edward Jolly, MD  Service setting: Ambulatory outpatient  Specialty: Interventional Pain Management  Patient type: Established  Location: [facility] (AMB) Pain Management Facility  Visit type: Interventional Procedure   Primary Reason for Visit: Interventional Pain Management Treatment. CC: Back Pain (left side)  Procedure:          Anesthesia, Analgesia, Anxiolysis:  Type: Therapeutic Inter-Laminar Epidural Steroid Injection #1  (for 2021) Region: Lumbar Level: L4-5 Level. Laterality: Left-Sided         Type: Local Anesthesia Indication(s): Analgesia         Route: Infiltration ([place]/IM) IV Access: Declined Sedation: Declined  Local Anesthetic: Lidocaine 1-2%   Indications: 1. Lumbar radiculopathy    2. Chronic pain syndrome    Pain Score: Pre-procedure: 4 /10 Post-procedure: 4 /10  Pre-op Assessment:  Rebekah Shaw is a 69 y.o. (year old), female patient, seen today for interventional treatment. She  has a past surgical history that includes Tonsillectomy and adenoidectomy; Appendectomy; Foot surgery; and Vascular surgery (Bilateral, 2009). Rebekah Shaw has a current medication list which includes the following prescription(s): acetaminophen, b complex vitamins, diclofenac sodium, tylenol pm extra strength, fexofenadine, magnesium, multivitamin-iron-minerals-folic acid, UNABLE TO FIND, cholecalciferol, biotin, ciprofloxacin, hydrochlorothiazide, ibuprofen, magnesium oxide, and fish oil. Her primarily concern today is the Back Pain (left side)  Initial Vital Signs:  Pulse/HCG Rate: 81ECG Heart Rate: 83 Temp: (!) 97.2 F (36.2 C) Resp: 18 BP: (!) 157/60 SpO2: 100 %  BMI: Estimated body mass index is 29.69 kg/m as calculated from the following:   Height as of this encounter: 5' (1.524 m).   Weight as of this encounter: 152 lb (68.9  kg).  Risk Assessment: Allergies: Reviewed. She is allergic to prednisone, atorvastatin, ezetimibe, ibandronic acid, pravastatin, rosuvastatin, simvastatin, and meloxicam.  Allergy Precautions: None required Coagulopathies: Reviewed. None identified.  Blood-thinner therapy: None at this time Active Infection(s): Reviewed. None identified. Rebekah Shaw is afebrile  Site Confirmation: Rebekah Shaw was asked to confirm the procedure and laterality before marking the site Procedure checklist: Completed Consent: Before the procedure and under the influence of no sedative(s), amnesic(s), or anxiolytics, the patient was informed of the treatment options, risks and possible complications. To fulfill our ethical and legal obligations, as recommended by the American Medical Association's Code of Ethics, I have informed the patient of my clinical impression; the nature and purpose of the treatment or procedure; the risks, benefits, and possible complications of the intervention; the alternatives, including doing nothing; the risk(s) and benefit(s) of the alternative treatment(s) or procedure(s); and the risk(s) and benefit(s) of doing nothing. The patient was provided information about the general risks and possible complications associated with the procedure. These may include, but are not limited to: failure to achieve desired goals, infection, bleeding, organ or nerve damage, allergic reactions, paralysis, and death. In addition, the patient was informed of those risks and complications associated to Spine-related procedures, such as failure to decrease pain; infection (i.e.: Meningitis, epidural or intraspinal abscess); bleeding (i.e.: epidural hematoma, subarachnoid hemorrhage, or any other type of intraspinal or peri-dural bleeding); organ or nerve damage (i.e.: Any type of peripheral nerve, nerve root, or spinal cord injury) with subsequent damage to sensory, motor, and/or autonomic systems, resulting in permanent  pain, numbness, and/or weakness of one or several areas of the body; allergic reactions; (i.e.: anaphylactic  reaction); and/or death. Furthermore, the patient was informed of those risks and complications associated with the medications. These include, but are not limited to: allergic reactions (i.e.: anaphylactic or anaphylactoid reaction(s)); adrenal axis suppression; blood sugar elevation that in diabetics may result in ketoacidosis or comma; water retention that in patients with history of congestive heart failure may result in shortness of breath, pulmonary edema, and decompensation with resultant heart failure; weight gain; swelling or edema; medication-induced neural toxicity; particulate matter embolism and blood vessel occlusion with resultant organ, and/or nervous system infarction; and/or aseptic necrosis of one or more joints. Finally, the patient was informed that Medicine is not an exact science; therefore, there is also the possibility of unforeseen or unpredictable risks and/or possible complications that may result in a catastrophic outcome. The patient indicated having understood very clearly. We have given the patient no guarantees and we have made no promises. Enough time was given to the patient to ask questions, all of which were answered to the patient's satisfaction. Rebekah Shaw has indicated that she wanted to continue with the procedure. Attestation: I, the ordering provider, attest that I have discussed with the patient the benefits, risks, side-effects, alternatives, likelihood of achieving goals, and potential problems during recovery for the procedure that I have provided informed consent. Date  Time: 01/19/2020  8:22 AM  Pre-Procedure Preparation:  Monitoring: As per clinic protocol. Respiration, ETCO2, SpO2, BP, heart rate and rhythm monitor placed and checked for adequate function Safety Precautions: Patient was assessed for positional comfort and pressure points before starting  the procedure. Time-out: I initiated and conducted the "Time-out" before starting the procedure, as per protocol. The patient was asked to participate by confirming the accuracy of the "Time Out" information. Verification of the correct person, site, and procedure were performed and confirmed by me, the nursing staff, and the patient. "Time-out" conducted as per Joint Commission's Universal Protocol (UP.01.01.01). Time: 0902  Description of Procedure:          Position: Prone with head of the table was raised to facilitate breathing. Target Area: The interlaminar space, initially targeting the lower laminar border of the superior vertebral body. Approach: Paramedial approach. Area Prepped: Entire Posterior Lumbar Region Prepping solution: ChloraPrep (2% chlorhexidine gluconate and 70% isopropyl alcohol) Safety Precautions: Aspiration looking for blood return was conducted prior to all injections. At no point did we inject any substances, as a needle was being advanced. No attempts were made at seeking any paresthesias. Safe injection practices and needle disposal techniques used. Medications properly checked for expiration dates. SDV (single dose vial) medications used. Description of the Procedure: Protocol guidelines were followed. The procedure needle was introduced through the skin, ipsilateral to the reported pain, and advanced to the target area. Bone was contacted and the needle walked caudad, until the lamina was cleared. The epidural space was identified using "loss-of-resistance technique" with 2-3 ml of PF-NaCl (0.9% NSS), in a 5cc LOR glass syringe. Vitals:   01/19/20 0828 01/19/20 0900 01/19/20 0905  BP: (!) 157/60 140/87 (!) 152/80  Pulse: 81    Resp: 18 16 16   Temp: (!) 97.2 F (36.2 C)    TempSrc: Temporal    SpO2: 100% 99% 95%  Weight: 152 lb (68.9 kg)    Height: 5' (1.524 m)      Start Time: 0902 hrs. End Time:   hrs. Materials:  Needle(s) Type: Epidural needle Gauge:  17G Length: 3.5-in Medication(s): Please see orders for medications and dosing details. 9 CC solution made  of 4cc of preservative-free saline, 4 cc of 0.2% ropivacaine, 1 cc of Decadron 10 mg/cc Imaging Guidance (Spinal):          Type of Imaging Technique: Fluoroscopy Guidance (Spinal) Indication(s): Assistance in needle guidance and placement for procedures requiring needle placement in or near specific anatomical locations not easily accessible without such assistance. Exposure Time: Please see nurses notes. Contrast: Before injecting any contrast, we confirmed that the patient did not have an allergy to iodine, shellfish, or radiological contrast. Once satisfactory needle placement was completed at the desired level, radiological contrast was injected. Contrast injected under live fluoroscopy. No contrast complications. See chart for type and volume of contrast used. Fluoroscopic Guidance: I was personally present during the use of fluoroscopy. "Tunnel Vision Technique" used to obtain the best possible view of the target area. Parallax error corrected before commencing the procedure. "Direction-depth-direction" technique used to introduce the needle under continuous pulsed fluoroscopy. Once target was reached, antero-posterior, oblique, and lateral fluoroscopic projection used confirm needle placement in all planes. Images permanently stored in EMR. Interpretation: I personally interpreted the imaging intraoperatively. Adequate needle placement confirmed in multiple planes. Appropriate spread of contrast into desired area was observed. No evidence of afferent or efferent intravascular uptake. No intrathecal or subarachnoid spread observed. Permanent images saved into the patient's record.  Antibiotic Prophylaxis:   Anti-infectives (From admission, onward)   None     Indication(s): None identified  Post-operative Assessment:  Post-procedure Vital Signs:  Pulse/HCG Rate: 8185 Temp: (!) 97.2  F (36.2 C) Resp: 16 BP: (!) 152/80 SpO2: 95 %  EBL: None  Complications: No immediate post-treatment complications observed by team, or reported by patient.  Note: The patient tolerated the entire procedure well. A repeat set of vitals were taken after the procedure and the patient was kept under observation following institutional policy, for this type of procedure. Post-procedural neurological assessment was performed, showing return to baseline, prior to discharge. The patient was provided with post-procedure discharge instructions, including a section on how to identify potential problems. Should any problems arise concerning this procedure, the patient was given instructions to immediately contact us, at any time, without hesitation. In any case, we plan to contact the patient by telephone for a follow-up status report regarding this interventional procedure.  Comments:  No additional relevant information. 5 out of 5 strength bilateral lower extremity: Plantar flexion, dorsiflexion, knee flexion, knee extension.  Plan of Care    Imaging Orders     DG PAIN CLINIC C-ARM 1-60 MIN NO REPORT Procedure Orders    No procedure(s) ordered today    Medications ordered for procedure: Meds ordered this encounter  Medications  . iohexol (OMNIPAQUE) 180 MG/ML injection 10 mL    Must be Myelogram-compatible. If not available, you may substitute with a water-soluble, non-ionic, hypoallergenic, myelogram-compatible radiological contrast medium.  Marland Kitchen. lidocaine (XYLOCAINE) 2 % (with pres) injection 400 mg  . ropivacaine (PF) 2 mg/mL (0.2%) (NAROPIN) injection 4 mL  . sodium chloride flush (NS) 0.9 % injection 4 mL  . dexamethasone (DECADRON) injection 10 mg   Medications administered: We administered iohexol, lidocaine, ropivacaine (PF) 2 mg/mL (0.2%), sodium chloride flush, and dexamethasone.  See the medical record for exact dosing, route, and time of administration.  New Prescriptions   No  medications on file   Disposition: Discharge home  Discharge Date & Time: 01/19/2020; 0915 hrs.   Physician-requested Follow-up: Return in about 6 weeks (around 03/01/2020) for Post Procedure Evaluation, virtual.  No future appointments.  Primary Care Physician: Lynnea Ferrier, MD Location: [facility] Note by: Edward Jolly, MD Date: 01/19/2020; Time: 9:12 AM  Disclaimer:  Medicine is not an exact science. The only guarantee in medicine is that nothing is guaranteed. It is important to note that the decision to proceed with this intervention was based on the information collected from the patient. The Data and conclusions were drawn from the patient's questionnaire, the interview, and the physical examination. Because the information was provided in large part by the patient, it cannot be guaranteed that it has not been purposely or unconsciously manipulated. Every effort has been made to obtain as much relevant data as possible for this evaluation. It is important to note that the conclusions that lead to this procedure are derived in large part from the available data. Always take into account that the treatment will also be dependent on availability of resources and existing treatment guidelines, considered by other Pain Management Practitioners as being common knowledge and practice, at the time of the intervention. For Medico-Legal purposes, it is also important to point out that variation in procedural techniques and pharmacological choices are the acceptable norm. The indications, contraindications, technique, and results of the above procedure should only be interpreted and judged by a Board-Certified Interventional Pain Specialist with extensive familiarity and expertise in the same exact procedure and technique.

## 2020-01-20 ENCOUNTER — Telehealth: Payer: Self-pay

## 2020-01-20 NOTE — Telephone Encounter (Signed)
Post procedure phone call.  Patient states she is doing well.  

## 2020-01-27 ENCOUNTER — Ambulatory Visit
Admission: RE | Admit: 2020-01-27 | Discharge: 2020-01-27 | Disposition: A | Discharge status: Home / Self Care | Payer: Medicare HMO | Source: Ambulatory Visit | Attending: Internal Medicine | Admitting: Internal Medicine

## 2020-01-27 DIAGNOSIS — N6320 Unspecified lump in the left breast, unspecified quadrant: Secondary | ICD-10-CM

## 2020-01-27 DIAGNOSIS — R928 Other abnormal and inconclusive findings on diagnostic imaging of breast: Secondary | ICD-10-CM | POA: Diagnosis present

## 2020-01-28 ENCOUNTER — Other Ambulatory Visit: Payer: Self-pay | Admitting: Internal Medicine

## 2020-01-28 DIAGNOSIS — R928 Other abnormal and inconclusive findings on diagnostic imaging of breast: Secondary | ICD-10-CM

## 2020-01-28 DIAGNOSIS — N6320 Unspecified lump in the left breast, unspecified quadrant: Secondary | ICD-10-CM

## 2020-02-11 ENCOUNTER — Ambulatory Visit
Admission: RE | Admit: 2020-02-11 | Discharge: 2020-02-11 | Disposition: A | Discharge status: Home / Self Care | Payer: Medicare HMO | Source: Ambulatory Visit | Attending: Internal Medicine | Admitting: Internal Medicine

## 2020-02-11 ENCOUNTER — Other Ambulatory Visit: Payer: Self-pay

## 2020-02-11 ENCOUNTER — Other Ambulatory Visit: Payer: Self-pay | Admitting: Internal Medicine

## 2020-02-11 DIAGNOSIS — R928 Other abnormal and inconclusive findings on diagnostic imaging of breast: Secondary | ICD-10-CM

## 2020-02-11 DIAGNOSIS — N6320 Unspecified lump in the left breast, unspecified quadrant: Secondary | ICD-10-CM | POA: Diagnosis present

## 2020-02-11 DIAGNOSIS — N6323 Unspecified lump in the left breast, lower outer quadrant: Secondary | ICD-10-CM | POA: Insufficient documentation

## 2020-02-24 ENCOUNTER — Ambulatory Visit (INDEPENDENT_AMBULATORY_CARE_PROVIDER_SITE_OTHER): Payer: Medicare HMO

## 2020-02-24 ENCOUNTER — Other Ambulatory Visit: Payer: Self-pay

## 2020-02-24 ENCOUNTER — Ambulatory Visit: Payer: Medicare HMO | Admitting: Podiatry

## 2020-02-24 ENCOUNTER — Encounter: Payer: Self-pay | Admitting: Podiatry

## 2020-02-24 ENCOUNTER — Encounter: Payer: Self-pay | Admitting: *Deleted

## 2020-02-24 DIAGNOSIS — M7752 Other enthesopathy of left foot: Secondary | ICD-10-CM

## 2020-02-24 DIAGNOSIS — R600 Localized edema: Secondary | ICD-10-CM | POA: Diagnosis not present

## 2020-02-24 DIAGNOSIS — M7672 Peroneal tendinitis, left leg: Secondary | ICD-10-CM | POA: Diagnosis not present

## 2020-02-24 DIAGNOSIS — M7750 Other enthesopathy of unspecified foot: Secondary | ICD-10-CM

## 2020-02-25 ENCOUNTER — Encounter: Payer: Self-pay | Admitting: Podiatry

## 2020-02-25 NOTE — Progress Notes (Signed)
Subjective:  Patient ID: Rebekah Shaw, female    DOB: Oct 31, 1950,  MRN: [id number]  Chief Complaint  Patient presents with  . Ankle Pain    patient presents today for left ankle pain and swelling x 2 weeks    69 y.o. female presents with the above complaint.  Patient presents with complaint of left lateral ankle pain.  Patient states been going for about 2 weeks there is some swelling associated with it.  Patient has not seen anyone else prior to see me for this.  She states that it is painful to walk on or put any weight without a boot on.  She had a boot previously that was given by our office for which she started utilizing.  She states it feels a lot better in the boot however she is not able to come out of the boot.  Patient has tried cam boot elevated Tylenol for treatment options.  She denies any other acute complaints.  She would also like to discuss swelling to the left lower extremity and see if there is any Unna boot application that could be applied to help with the swelling.  She denies any other acute complaints.   Review of Systems: Negative except as noted in the HPI. Denies N/V/F/Ch.  Past Medical History:  Diagnosis Date  . Allergy   . Arthritis 2019   osteoarthritis, hips bilat  . Hyperlipidemia   . Osteoporosis    right wrist, hips bilat    Current Outpatient Medications:  .  acetaminophen (TYLENOL) 500 MG tablet, Take 500 mg by mouth every 6 (six) hours as needed., Disp: , Rfl:  .  b complex vitamins tablet, Take 1 tablet by mouth daily., Disp: , Rfl:  .  BIOTIN 5000 PO, Take by mouth. (Patient not taking: Reported on 01/19/2020), Disp: , Rfl:  .  ciprofloxacin (CIPRO) 500 MG tablet, , Disp: , Rfl:  .  diclofenac sodium (VOLTAREN) 1 % GEL, Apply topically 4 (four) times daily., Disp: , Rfl:  .  diphenhydrAMINE-APAP, sleep, (TYLENOL PM EXTRA STRENGTH) 50-1000 MG/30ML LIQD, Take by mouth., Disp: , Rfl:  .  fenofibrate 160 MG tablet, TAKE 1 TABLET (160 MG TOTAL) BY  MOUTH ONCE DAILY, Disp: , Rfl:  .  fexofenadine (ALLEGRA) 180 MG tablet, Take 180 mg by mouth daily., Disp: , Rfl:  .  hydrochlorothiazide (HYDRODIURIL) 12.5 MG tablet, Take by mouth., Disp: , Rfl:  .  ibuprofen (ADVIL,MOTRIN) 800 MG tablet, Take 800 mg by mouth every 8 (eight) hours as needed. (Patient not taking: Reported on 01/19/2020), Disp: , Rfl:  .  Magnesium 400 MG CAPS, Take by mouth., Disp: , Rfl:  .  magnesium oxide (MAG-OX) 400 MG tablet, Take by mouth. (Patient not taking: Reported on 01/19/2020), Disp: , Rfl:  .  multivitamin-iron-minerals-folic acid (CENTRUM) chewable tablet, Chew 1 tablet by mouth daily., Disp: , Rfl:  .  Omega-3 Fatty Acids (FISH OIL) 1200 MG CAPS, Take by mouth. (Patient not taking: Reported on 01/19/2020), Disp: , Rfl:  .  UNABLE TO FIND, Take by mouth. folic acid/multivit-min/lutein (CENTRUM SILVER ORAL), Disp: , Rfl:  .  VITAMIN D, CHOLECALCIFEROL, PO, Take by mouth., Disp: , Rfl:   Social History   Tobacco Use  Smoking Status Never Smoker  Smokeless Tobacco Never Used    Allergies  Allergen Reactions  . Prednisone Diarrhea and Nausea And Vomiting  . Atorvastatin Other (See Comments)  . Ezetimibe Other (See Comments)    intolerant  . Ibandronic Acid  Other (See Comments)    dyspepsia  . Pravastatin Other (See Comments)  . Rosuvastatin Other (See Comments)    May have caused abdominal pain  . Simvastatin Other (See Comments)  . Meloxicam Rash and Swelling    RASH AND LEG SWELLING   Objective:  There were no vitals filed for this visit. There is no height or weight on file to calculate BMI. Constitutional Well developed. Well nourished.  Vascular Dorsalis pedis pulses palpable bilaterally. Posterior tibial pulses palpable bilaterally. Capillary refill normal to all digits.  No cyanosis or clubbing noted. Pedal hair growth normal.  Neurologic Normal speech. Oriented to person, place, and time. Epicritic sensation to light touch grossly  present bilaterally.  Dermatologic Nails well groomed and normal in appearance. No open wounds. No skin lesions.  Orthopedic:  Pain on palpation along the course of the peroneal tendon.  No pain at the insertion of the peroneal tendon.  Pain right behind the lateral malleolus.  Pain with resisted dorsiflexion eversion of the foot.  No pain with plantarflexion inversion of the foot.  1+ pitting edema noted to left lower extremity.   Radiographs: 3 views of skeletally mature adult left ankle: Osseous integrity of the ankle joint within normal limits.  No fractures noted.  No osteophytes noted. Assessment:   1. Leg edema, left   2. Peroneal tendinitis, left    Plan:  Patient was evaluated and treated and all questions answered.  Left peroneal tendinitis -Explained to the patient the etiology of tendinitis and various treatment options were discussed.  I believe patient will benefit from continuous cam boot immobilization to allow the soft tissue to heal properly.  He can take up to 4 to 6 weeks for her to heal properly.  I discussed this with the patient patient states understanding.  If there is no improvement will consider getting an MRI.  Patient does not want any injections.  Edema left lower extremity generalized -Given the amount of edema that is present I believe patient will benefit from Unna boot compression therapy.  Unna boot was applied in standard technique.  No complication noted.  No follow-ups on file.

## 2020-02-27 ENCOUNTER — Telehealth: Payer: Self-pay

## 2020-02-27 ENCOUNTER — Encounter: Payer: Self-pay | Admitting: Student in an Organized Health Care Education/Training Program

## 2020-02-27 NOTE — Telephone Encounter (Signed)
Patient state she spoke to Diplomatic Services operational officer who spoke to Dr Cherylann Ratel and she was approved to come in for F2F instead of virtual.  Please chang this on the schedule.  Thanks

## 2020-02-29 IMAGING — MG DIGITAL SCREENING BILATERAL MAMMOGRAM WITH TOMO AND CAD
8 series · 8 of 24 positions shown · non-contrast
Comparison: Previous exam(s).

CLINICAL DATA: Screening.

EXAM:
DIGITAL SCREENING BILATERAL MAMMOGRAM WITH TOMO AND CAD

[L CC synth-2D]
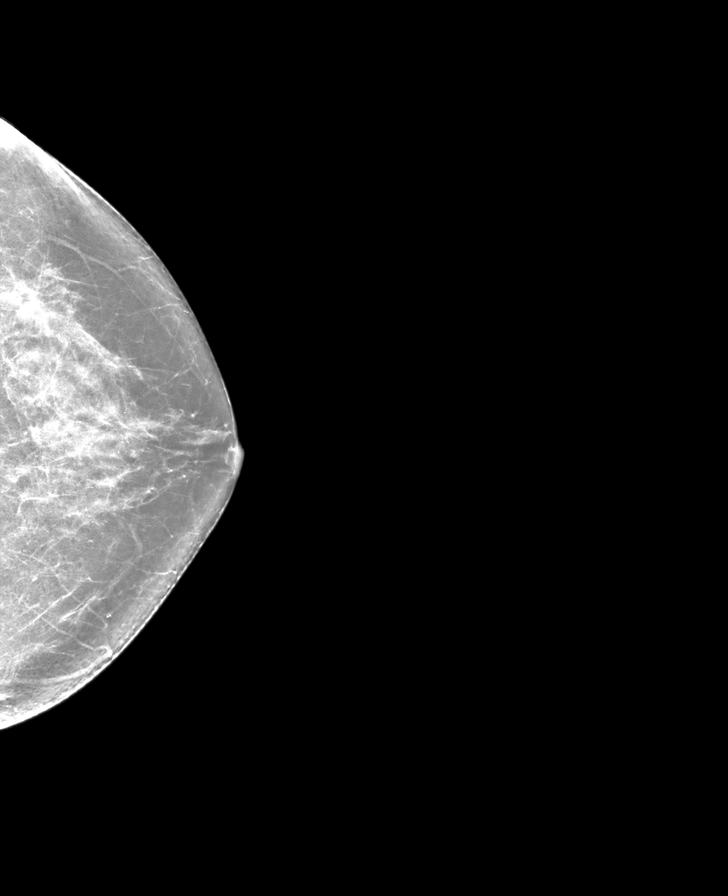

[L MLO synth-2D]
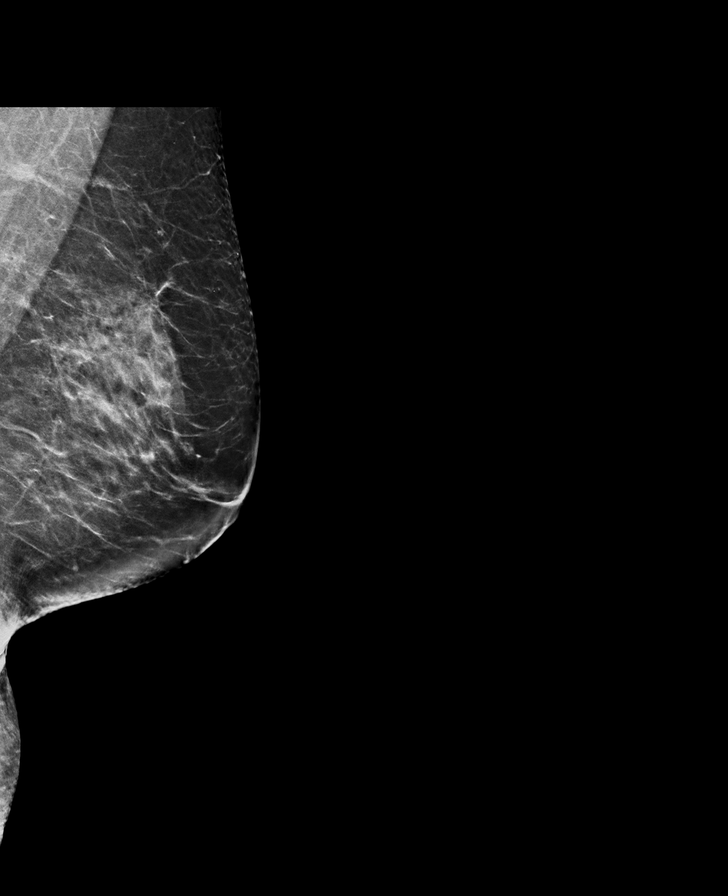

[R CC synth-2D]
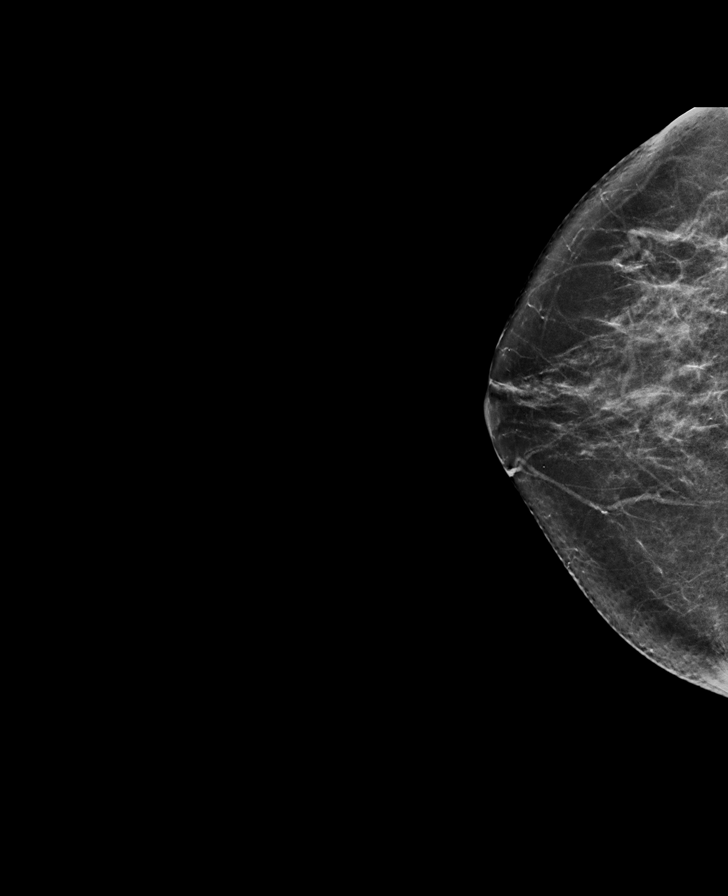

[R MLO synth-2D]
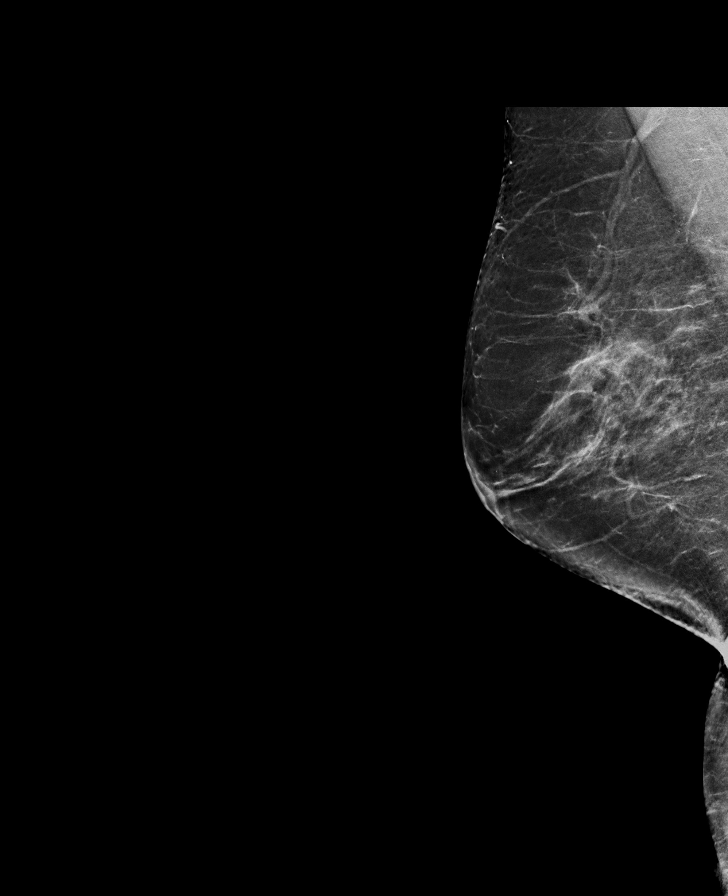

[L MLO tomo · tomo slice 39/77.0]
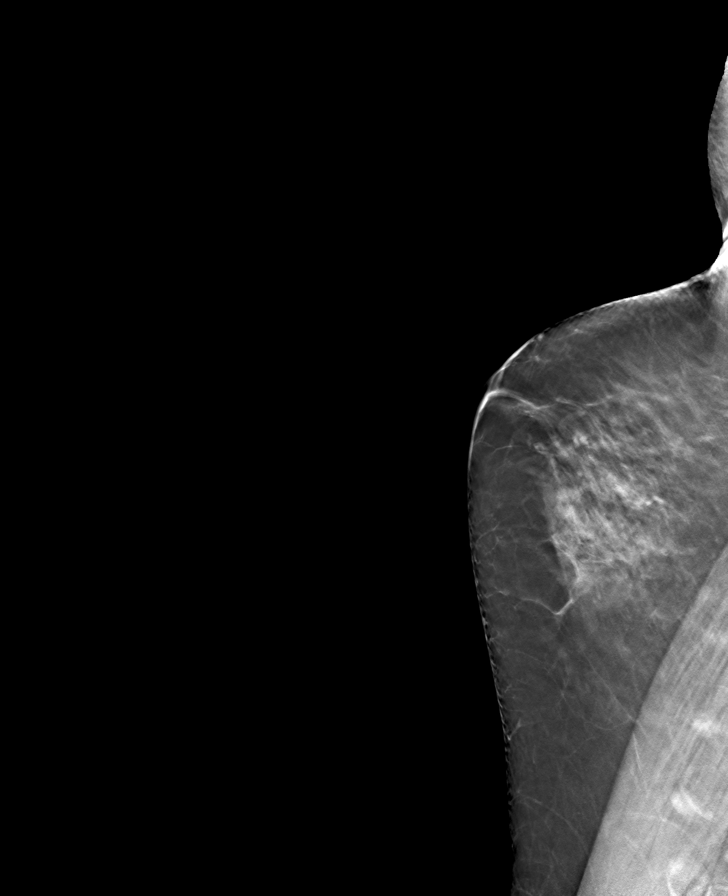

[R CC tomo · tomo slice 34/67.0]
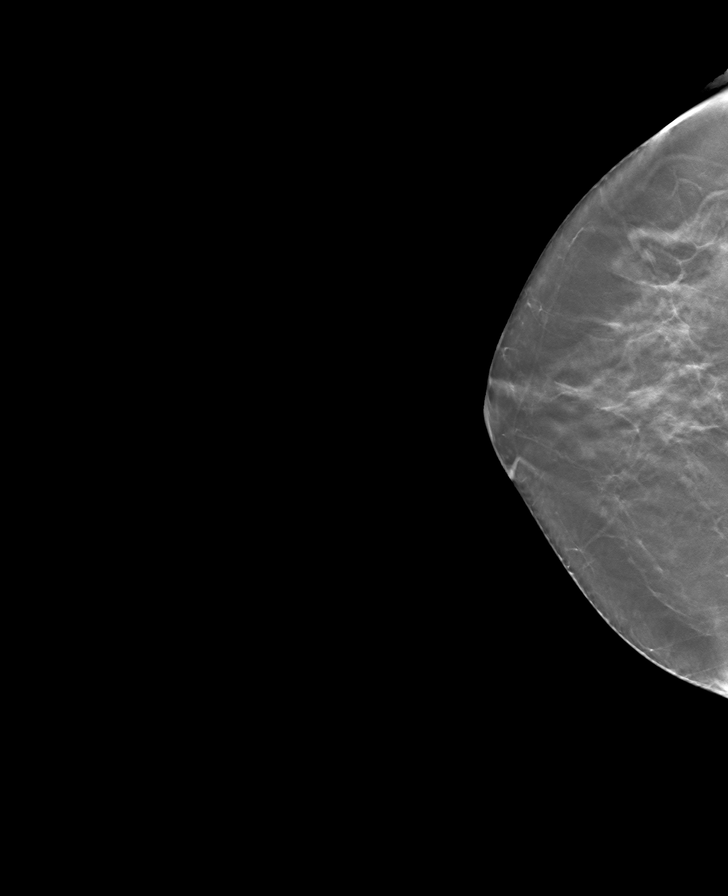

[L CC tomo · tomo slice 37/72.0]
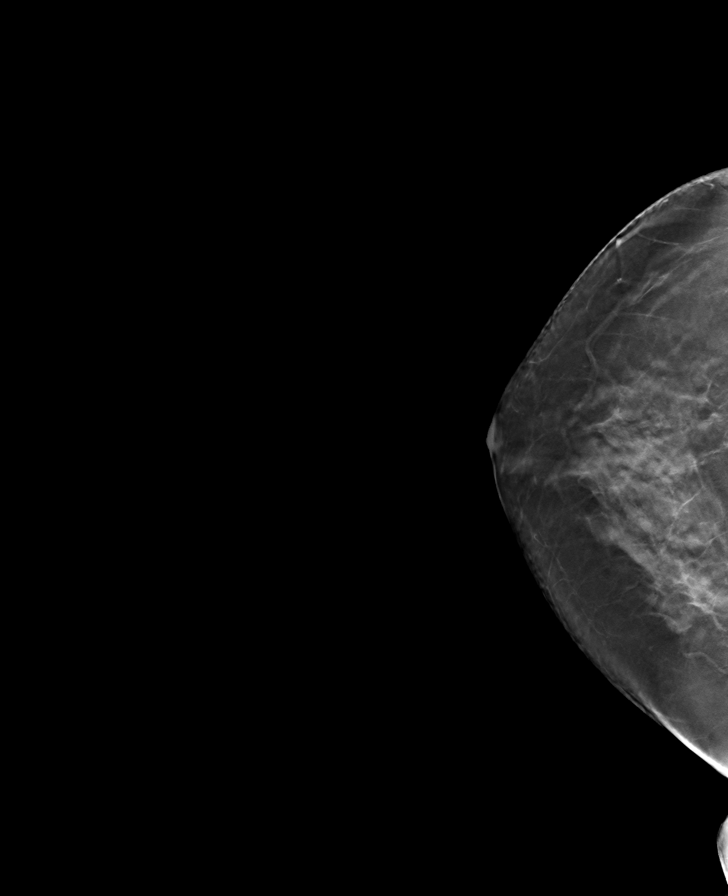

[R MLO tomo · tomo slice 37/72.0]
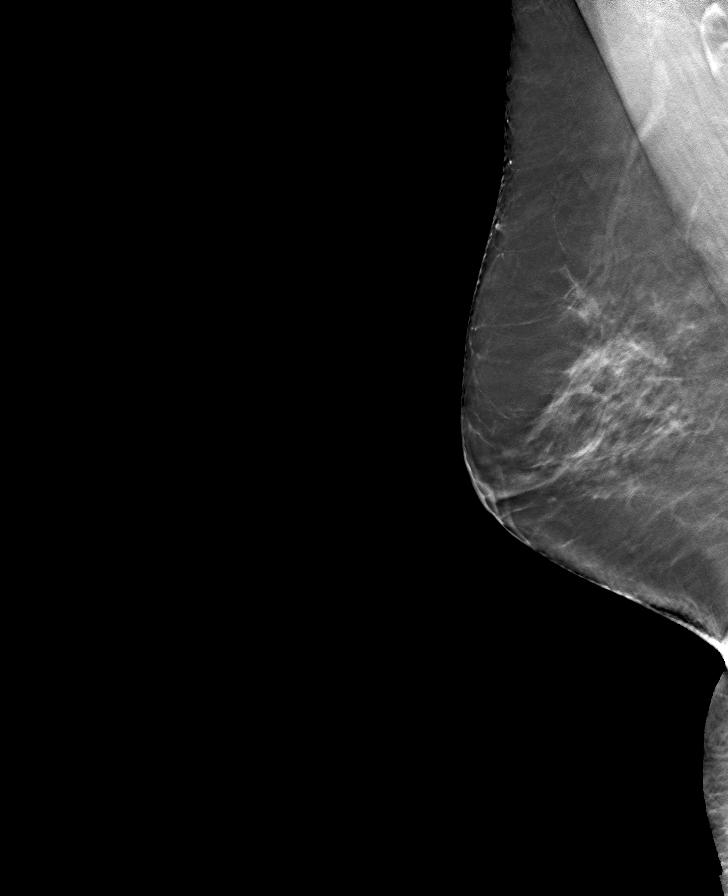

[8 of 24 positions shown; findings below may reference images not displayed]

ACR Breast Density Category c: The breast tissue is heterogeneously
dense, which may obscure small masses.
FINDINGS: There are no findings suspicious for malignancy. Images were
processed with CAD.
IMPRESSION: No mammographic evidence of malignancy. A result letter of this
screening mammogram will be mailed directly to the patient.

RECOMMENDATION:
Screening mammogram in one year. (Code:FT-U-LHB)

BI-RADS CATEGORY  1: Negative.

## 2020-03-01 ENCOUNTER — Ambulatory Visit
Payer: Medicare HMO | Attending: Student in an Organized Health Care Education/Training Program | Admitting: Student in an Organized Health Care Education/Training Program

## 2020-03-01 ENCOUNTER — Telehealth: Payer: Self-pay | Admitting: Podiatry

## 2020-03-01 ENCOUNTER — Encounter: Payer: Self-pay | Admitting: Student in an Organized Health Care Education/Training Program

## 2020-03-01 ENCOUNTER — Other Ambulatory Visit: Payer: Self-pay

## 2020-03-01 VITALS — BP 120/86 | HR 84 | Temp 97.0°F | Resp 16 | Ht 60.0 in | Wt 152.0 lb

## 2020-03-01 DIAGNOSIS — M5416 Radiculopathy, lumbar region: Secondary | ICD-10-CM | POA: Insufficient documentation

## 2020-03-01 DIAGNOSIS — G894 Chronic pain syndrome: Secondary | ICD-10-CM | POA: Insufficient documentation

## 2020-03-01 DIAGNOSIS — M5136 Other intervertebral disc degeneration, lumbar region: Secondary | ICD-10-CM | POA: Insufficient documentation

## 2020-03-01 NOTE — Telephone Encounter (Signed)
Yes we can remove  the Unna boot on 03/09/20.

## 2020-03-01 NOTE — Progress Notes (Signed)
PROVIDER NOTE: Information contained herein reflects review and annotations entered in association with encounter. Interpretation of such information and data should be left to medically-trained personnel. Information provided to patient can be located elsewhere in the medical record under "Patient Instructions". Document created using STT-dictation technology, any transcriptional errors that may result from process are unintentional.    Patient: Rebekah Shaw  Service Category: E/M  Provider: Gillis Santa, MD  DOB: 03-28-1951  DOS: 03/01/2020  Specialty: Interventional Pain Management  MRN: [id number]  Setting: Ambulatory outpatient  PCP: Adin Hector, MD  Type: Established Patient    Referring Provider: Adin Hector, MD  Location: Office  Delivery: Face-to-face     HPI  Reason for encounter: Ms. Rebekah Shaw, a 69 y.o. year old female, is here today for evaluation and management of her Lumbar radiculopathy [M54.16]. Ms. Rebekah Shaw primary complain today is Back Pain and Foot Pain (left, tendonitis wearing a boot. ) Last encounter: Practice (02/27/2020). My last encounter with her was on 01/19/2020. Pertinent problems: Ms. Rebekah Shaw has Lumbar radiculopathy (Right L4/5); Lumbar spondylosis; Lumbar facet arthropathy; Lumbar degenerative disc disease; Compression fracture of L5 lumbar vertebra, sequela; and Chronic pain syndrome on their pertinent problem list. Pain Assessment: Severity of Chronic pain is reported as a 5 /10. Location: Back (acute foot pain) Lower, Left, Right/denies. Onset:  . Quality: Discomfort (foot pain is aching, pins and needles,). Timing:  . Modifying factor(s): getting off feet as often as possible.. Vitals:  height is 5' (1.524 m) and weight is 152 lb (68.9 kg). Her temporal temperature is 97 F (36.1 C) (abnormal). Her blood pressure is 120/86 and her pulse is 84. Her respiration is 16 and oxygen saturation is 97%.    Post-Procedure Evaluation  Procedure (01/19/2020): Left  L4-L5 ESI #1 for 2021  Sedation: Please see nurses note.  Effectiveness during initial hour after procedure(Ultra-Short Term Relief): 100 %   Local anesthetic used: Long-acting (4-6 hours) Effectiveness: Defined as any analgesic benefit obtained secondary to the administration of local anesthetics. This carries significant diagnostic value as to the etiological location, or anatomical origin, of the pain. Duration of benefit is expected to coincide with the duration of the local anesthetic used.  Effectiveness during initial 4-6 hours after procedure(Short-Term Relief): 100 %   Long-term benefit: Defined as any relief past the pharmacologic duration of the local anesthetics.  Effectiveness past the initial 6 hours after procedure(Long-Term Relief): 60 %   Current benefits: Defined as benefit that persist at this time.   Analgesia:  >50% relief Function: [name] reports improvement in function ROM: Ms. Rebekah Shaw reports improvement in ROM   ROS  Constitutional: Denies any fever or chills Gastrointestinal: No reported hemesis, hematochezia, vomiting, or acute GI distress Musculoskeletal: Left foot pain, boot in place, low back pain Neurological: No reported episodes of acute onset apraxia, aphasia, dysarthria, agnosia, amnesia, paralysis, loss of coordination, or loss of consciousness  Medication Review  Biotin, Cholecalciferol, Fish Oil, Magnesium, UNABLE TO FIND, acetaminophen, b complex vitamins, ciprofloxacin, diclofenac sodium, diphenhydrAMINE-APAP (sleep), fenofibrate, fexofenadine, hydrochlorothiazide, ibuprofen, magnesium oxide, and multivitamin-iron-minerals-folic acid  History Review  Allergy: Ms. Rebekah Shaw is allergic to prednisone, atorvastatin, ezetimibe, ibandronic acid, pravastatin, rosuvastatin, simvastatin, and meloxicam. Drug: Ms. Rebekah Shaw  reports no history of drug use. Alcohol:  reports no history of alcohol use. Tobacco:  reports that she has never smoked. She has never used  smokeless tobacco. Social: Ms. Rebekah Shaw  reports that she has never smoked. She has never used  smokeless tobacco. She reports that she does not drink alcohol and does not use drugs. Medical:  has a past medical history of Allergy, Arthritis (2019), Hyperlipidemia, and Osteoporosis. Surgical: Ms. Rebekah Shaw  has a past surgical history that includes Tonsillectomy and adenoidectomy; Appendectomy; Foot surgery; and Vascular surgery (Bilateral, 2009). Family: family history includes Cancer in her father.  Laboratory Chemistry Profile   Renal No results found for: BUN, CREATININE, LABCREA, BCR, GFR, GFRAA, GFRNONAA, LABVMA, EPIRU, EPINEPH24HUR, NOREPRU, NOREPI24HUR, DOPARU, [id number]   Hepatic No results found for: AST, ALT, ALBUMIN, ALKPHOS, HCVAB, AMYLASE, LIPASE, AMMONIA   Electrolytes No results found for: NA, K, CL, CALCIUM, MG, PHOS   Bone No results found for: VD25OH, [id number], FA2130QM5, [id number], 25OHVITD1, 25OHVITD2, 25OHVITD3, TESTOFREE, TESTOSTERONE   Inflammation (CRP: Acute Phase) (ESR: Chronic Phase) No results found for: CRP, ESRSEDRATE, LATICACIDVEN     Note: Above Lab results reviewed.  Recent Imaging Review  DG Ankle Complete Left Please see detailed radiograph report in office note. Note: Reviewed        Physical Exam  General appearance: Well nourished, well developed, and well hydrated. In no apparent acute distress Mental status: Alert, oriented x 3 (person, place, & time)       Respiratory: No evidence of acute respiratory distress Eyes: PERLA Vitals: BP 120/86 (BP Location: Left Arm, Patient Position: Sitting, Cuff Size: Normal)   Pulse 84   Temp (!) 97 F (36.1 C) (Temporal)   Resp 16   Ht 5' (1.524 m)   Wt 152 lb (68.9 kg)   SpO2 97%   BMI 29.69 kg/m  BMI: Estimated body mass index is 29.69 kg/m as calculated from the following:   Height as of this encounter: 5' (1.524 m).   Weight as of this encounter: 152 lb (68.9 kg). Ideal: Ideal body weight: 45.5  kg (100 lb 4.9 oz) Adjusted ideal body weight: 54.9 kg (120 lb 15.8 oz)   Lumbar Spine Area Exam  Skin & Axial Inspection: No masses, redness, or swelling Alignment: Symmetrical Functional ROM: Improved after treatment       Stability: No instability detected Muscle Tone/Strength: Functionally intact. No obvious neuro-muscular anomalies detected. Sensory (Neurological): Improved  Gait & Posture Assessment  Ambulation: Limited Gait: Antalgic left boot in place for tendinitis Posture: WNL  Lower Extremity Exam    Side: Right lower extremity  Side: Left lower extremity  Stability: No instability observed          Stability: No instability observed          Skin & Extremity Inspection: Skin color, temperature, and hair growth are WNL. No peripheral edema or cyanosis. No masses, redness, swelling, asymmetry, or associated skin lesions. No contractures.  Skin & Extremity Inspection: Boot in place for left tendinitis  Functional ROM: Unrestricted ROM                  Functional ROM: Unrestricted ROM                  Muscle Tone/Strength: Functionally intact. No obvious neuro-muscular anomalies detected.  Muscle Tone/Strength: Functionally intact. No obvious neuro-muscular anomalies detected.  Sensory (Neurological): Unimpaired        Sensory (Neurological): Unimpaired        DTR: Patellar: deferred today Achilles: deferred today Plantar: deferred today  DTR: Patellar: deferred today Achilles: deferred today Plantar: deferred today  Palpation: No palpable anomalies  Palpation: No palpable anomalies    Assessment   Status Diagnosis  Controlled Controlled Controlled  1. Lumbar radiculopathy    2. Lumbar degenerative disc disease   3. Chronic pain syndrome      Plan of Care   Therapeutic lumbar epidural steroid injection.  Patient finding benefit in regards to her pain relief and improvement in functional status.  Repeat as needed.  Orders:  Orders Placed This Encounter   Procedures  . Lumbar Epidural Injection    Standing Status:   Standing    Number of Occurrences:   9    Standing Expiration Date:   03/01/2021    Scheduling Instructions:     Purpose: Palliative     Indication: Lower extremity pain/Sciatica unspecified side (M54.30).     Side: Midline     Level: TBD     Sedation: Patient's choice.     TIMEFRAME: PRN procedure. (Ms. Trammel will call when needed.)    Order Specific Question:   Where will this procedure be performed?    Answer:   [facility]   Follow-up plan:   Return if symptoms worsen or fail to improve.     Status post left L4-L5 ESI on 01/19/2020.  Repeat as needed.   Recent Visits Date Type Provider Dept  01/19/20 Procedure visit Gillis Santa, MD [facility]  12/16/19 Office Visit Gillis Santa, MD [facility]  Showing recent visits within past 90 days and meeting all other requirements Today's Visits Date Type Provider Dept  03/01/20 Telemedicine Gillis Santa, MD [facility]  Showing today's visits and meeting all other requirements Future Appointments No visits were found meeting these conditions. Showing future appointments within next 90 days and meeting all other requirements  I discussed the assessment and treatment plan with the patient. The patient was provided an opportunity to ask questions and all were answered. The patient agreed with the plan and demonstrated an understanding of the instructions.  Patient advised to call back or seek an in-person evaluation if the symptoms or condition worsens.  Duration of encounter:20 minutes.  Note by: Gillis Santa, MD Date: 03/01/2020; Time: 3:23 PM

## 2020-03-01 NOTE — Patient Instructions (Signed)
1. For the patient's left foot pain due to tendonitis, recommend max 6 hrs of work a day for 18 hours per week and once foot has healed, limit work to no more than 25 hours per week.  2. Doing well from lumbar standpoint, can repeat as needed

## 2020-03-09 ENCOUNTER — Other Ambulatory Visit: Payer: Self-pay

## 2020-03-09 ENCOUNTER — Ambulatory Visit: Payer: Medicare HMO | Admitting: Podiatry

## 2020-03-09 DIAGNOSIS — M7672 Peroneal tendinitis, left leg: Secondary | ICD-10-CM

## 2020-03-09 DIAGNOSIS — R600 Localized edema: Secondary | ICD-10-CM | POA: Diagnosis not present

## 2020-03-11 ENCOUNTER — Encounter: Payer: Self-pay | Admitting: Podiatry

## 2020-03-11 NOTE — Progress Notes (Signed)
Subjective:  Patient ID: Rebekah Shaw, female    DOB: 09/08/1950,  MRN: [id number]  No chief complaint on file.   69 y.o. female presents with the above complaint.  Patient presents with a follow-up of left peroneal tendinitis.  Patient states the pain has gotten a little bit better while being in the cam boot.  She has not been able to come out of the cam boot to transition to regular sneakers.  She would like to discuss further treatment options to help with this.   Review of Systems: Negative except as noted in the HPI. Denies N/V/F/Ch.  Past Medical History:  Diagnosis Date  . Allergy   . Arthritis 2019   osteoarthritis, hips bilat  . Hyperlipidemia   . Osteoporosis    right wrist, hips bilat    Current Outpatient Medications:  .  acetaminophen (TYLENOL) 500 MG tablet, Take 500 mg by mouth every 6 (six) hours as needed., Disp: , Rfl:  .  b complex vitamins tablet, Take 1 tablet by mouth daily., Disp: , Rfl:  .  BIOTIN 5000 PO, Take by mouth. (Patient not taking: Reported on 01/19/2020), Disp: , Rfl:  .  ciprofloxacin (CIPRO) 500 MG tablet, , Disp: , Rfl:  .  diclofenac sodium (VOLTAREN) 1 % GEL, Apply topically 4 (four) times daily., Disp: , Rfl:  .  diphenhydrAMINE-APAP, sleep, (TYLENOL PM EXTRA STRENGTH) 50-1000 MG/30ML LIQD, Take by mouth., Disp: , Rfl:  .  fenofibrate 160 MG tablet, TAKE 1 TABLET (160 MG TOTAL) BY MOUTH ONCE DAILY, Disp: , Rfl:  .  fexofenadine (ALLEGRA) 180 MG tablet, Take 180 mg by mouth daily., Disp: , Rfl:  .  hydrochlorothiazide (HYDRODIURIL) 12.5 MG tablet, Take by mouth., Disp: , Rfl:  .  ibuprofen (ADVIL,MOTRIN) 800 MG tablet, Take 800 mg by mouth every 8 (eight) hours as needed. (Patient not taking: Reported on 01/19/2020), Disp: , Rfl:  .  Magnesium 400 MG CAPS, Take by mouth., Disp: , Rfl:  .  magnesium oxide (MAG-OX) 400 MG tablet, Take by mouth. (Patient not taking: Reported on 01/19/2020), Disp: , Rfl:  .  multivitamin-iron-minerals-folic acid  (CENTRUM) chewable tablet, Chew 1 tablet by mouth daily., Disp: , Rfl:  .  Omega-3 Fatty Acids (FISH OIL) 1200 MG CAPS, Take by mouth. (Patient not taking: Reported on 01/19/2020), Disp: , Rfl:  .  UNABLE TO FIND, Take by mouth. folic acid/multivit-min/lutein (CENTRUM SILVER ORAL), Disp: , Rfl:  .  VITAMIN D, CHOLECALCIFEROL, PO, Take by mouth., Disp: , Rfl:   Social History   Tobacco Use  Smoking Status Never Smoker  Smokeless Tobacco Never Used    Allergies  Allergen Reactions  . Prednisone Diarrhea and Nausea And Vomiting  . Atorvastatin Other (See Comments)  . Ezetimibe Other (See Comments)    intolerant  . Ibandronic Acid Other (See Comments)    dyspepsia  . Pravastatin Other (See Comments)  . Rosuvastatin Other (See Comments)    May have caused abdominal pain  . Simvastatin Other (See Comments)  . Meloxicam Rash and Swelling    RASH AND LEG SWELLING   Objective:  There were no vitals filed for this visit. There is no height or weight on file to calculate BMI. Constitutional Well developed. Well nourished.  Vascular Dorsalis pedis pulses palpable bilaterally. Posterior tibial pulses palpable bilaterally. Capillary refill normal to all digits.  No cyanosis or clubbing noted. Pedal hair growth normal.  Neurologic Normal speech. Oriented to person, place, and time. Epicritic sensation to  light touch grossly present bilaterally.  Dermatologic Nails well groomed and normal in appearance. No open wounds. No skin lesions.  Orthopedic:  Pain on palpation along the course of the peroneal tendon.  No pain at the insertion of the peroneal tendon.  Pain right behind the lateral malleolus.  Pain with resisted dorsiflexion eversion of the foot.  No pain with plantarflexion inversion of the foot.  1+ pitting edema noted to left lower extremity.   Radiographs: 3 views of skeletally mature adult left ankle: Osseous integrity of the ankle joint within normal limits.  No fractures  noted.  No osteophytes noted. Assessment:   No diagnosis found. Plan:  Patient was evaluated and treated and all questions answered.  Left peroneal tendinitis -Explained to the patient the etiology of tendinitis and various treatment options were discussed.  Cam boot immobilization definitely gave her some pain relief however she is not able to transition out of the cam boot into regular sneakers.  Therefore I believe patient will benefit from a Tri-Lock brace to give stability and to help with transition.  Patient states understanding and will work on transition.   I discussed this with the patient patient states understanding.  If there is no improvement will consider getting an MRI.  Patient does not want any injections.  Edema left lower extremity generalized -Resolved with 1 application of Unna boot. No follow-ups on file.

## 2020-03-16 ENCOUNTER — Ambulatory Visit: Payer: Medicare HMO | Admitting: Podiatry

## 2020-03-23 ENCOUNTER — Other Ambulatory Visit: Payer: Self-pay

## 2020-03-23 ENCOUNTER — Encounter: Payer: Self-pay | Admitting: Podiatry

## 2020-03-23 ENCOUNTER — Ambulatory Visit: Payer: Medicare HMO | Admitting: Podiatry

## 2020-03-23 DIAGNOSIS — M7672 Peroneal tendinitis, left leg: Secondary | ICD-10-CM

## 2020-03-24 ENCOUNTER — Encounter: Payer: Self-pay | Admitting: Podiatry

## 2020-03-24 NOTE — Progress Notes (Signed)
Subjective:  Patient ID: Rebekah Shaw, female    DOB: December 31, 1950,  MRN: [id number]  Chief Complaint  Patient presents with  . Foot Pain    "its doing better"    69 y.o. female presents with the above complaint.  Patient presents with a follow-up of left peroneal tendinitis.  Patient states the pain is a lot better.  She is overall doing better however she has not transition away from the cam boot into regular sneakers.  I rediscussed with the patient to utilize the Tri-Lock brace as well as a newly given plantar fascial brace to help with the transition.  Patient states understanding.   Review of Systems: Negative except as noted in the HPI. Denies N/V/F/Ch.  Past Medical History:  Diagnosis Date  . Allergy   . Arthritis 2019   osteoarthritis, hips bilat  . Hyperlipidemia   . Osteoporosis    right wrist, hips bilat    Current Outpatient Medications:  .  acetaminophen (TYLENOL) 500 MG tablet, Take 500 mg by mouth every 6 (six) hours as needed., Disp: , Rfl:  .  b complex vitamins tablet, Take 1 tablet by mouth daily., Disp: , Rfl:  .  BIOTIN 5000 PO, Take by mouth. (Patient not taking: Reported on 01/19/2020), Disp: , Rfl:  .  ciprofloxacin (CIPRO) 500 MG tablet, , Disp: , Rfl:  .  diclofenac sodium (VOLTAREN) 1 % GEL, Apply topically 4 (four) times daily., Disp: , Rfl:  .  diphenhydrAMINE-APAP, sleep, (TYLENOL PM EXTRA STRENGTH) 50-1000 MG/30ML LIQD, Take by mouth., Disp: , Rfl:  .  fenofibrate 160 MG tablet, TAKE 1 TABLET (160 MG TOTAL) BY MOUTH ONCE DAILY, Disp: , Rfl:  .  fexofenadine (ALLEGRA) 180 MG tablet, Take 180 mg by mouth daily., Disp: , Rfl:  .  hydrochlorothiazide (HYDRODIURIL) 12.5 MG tablet, Take by mouth., Disp: , Rfl:  .  ibuprofen (ADVIL,MOTRIN) 800 MG tablet, Take 800 mg by mouth every 8 (eight) hours as needed. (Patient not taking: Reported on 01/19/2020), Disp: , Rfl:  .  Magnesium 400 MG CAPS, Take by mouth., Disp: , Rfl:  .  magnesium oxide (MAG-OX) 400 MG  tablet, Take by mouth. (Patient not taking: Reported on 01/19/2020), Disp: , Rfl:  .  multivitamin-iron-minerals-folic acid (CENTRUM) chewable tablet, Chew 1 tablet by mouth daily., Disp: , Rfl:  .  Omega-3 Fatty Acids (FISH OIL) 1200 MG CAPS, Take by mouth. (Patient not taking: Reported on 01/19/2020), Disp: , Rfl:  .  UNABLE TO FIND, Take by mouth. folic acid/multivit-min/lutein (CENTRUM SILVER ORAL), Disp: , Rfl:  .  VITAMIN D, CHOLECALCIFEROL, PO, Take by mouth., Disp: , Rfl:   Social History   Tobacco Use  Smoking Status Never Smoker  Smokeless Tobacco Never Used    Allergies  Allergen Reactions  . Prednisone Diarrhea and Nausea And Vomiting  . Atorvastatin Other (See Comments)  . Ezetimibe Other (See Comments)    intolerant  . Ibandronic Acid Other (See Comments)    dyspepsia  . Pravastatin Other (See Comments)  . Rosuvastatin Other (See Comments)    May have caused abdominal pain  . Simvastatin Other (See Comments)  . Meloxicam Rash and Swelling    RASH AND LEG SWELLING   Objective:  There were no vitals filed for this visit. There is no height or weight on file to calculate BMI. Constitutional Well developed. Well nourished.  Vascular Dorsalis pedis pulses palpable bilaterally. Posterior tibial pulses palpable bilaterally. Capillary refill normal to all digits.  No  cyanosis or clubbing noted. Pedal hair growth normal.  Neurologic Normal speech. Oriented to person, place, and time. Epicritic sensation to light touch grossly present bilaterally.  Dermatologic Nails well groomed and normal in appearance. No open wounds. No skin lesions.  Orthopedic:  Mild pain on palpation along the course of the peroneal tendon.  No pain at the insertion of the peroneal tendon.  Mild pain right behind the lateral malleolus.  Mild mild pain with resisted dorsiflexion eversion of the foot.  No pain with plantarflexion inversion of the foot.     Radiographs: 3 views of skeletally mature  adult left ankle: Osseous integrity of the ankle joint within normal limits.  No fractures noted.  No osteophytes noted. Assessment:   1. Peroneal tendinitis, left    Plan:  Patient was evaluated and treated and all questions answered.  Left peroneal tendinitis -Patient was hesitant to begin the transition from cam boot into a Tri-Lock ankle brace.  Patient was given another Tri-Lock ankle brace as well as a low-profile plantar fascial brace to help with the transition.  I encouraged patient to utilize a plantar fascial brace to help with the transition and if a little bit more stability is needed you can utilize a Tri-Lock brace.  Patient states understanding. -If there is no improvement we will discuss getting an MRI during next clinic visit.  Edema left lower extremity generalized -Resolved with 1 application of Unna boot. No follow-ups on file.

## 2020-04-15 ENCOUNTER — Ambulatory Visit: Payer: Medicare HMO | Admitting: Podiatry

## 2020-08-18 ENCOUNTER — Ambulatory Visit: Admit: 2020-08-18 | Payer: Medicare HMO | Admitting: Ophthalmology

## 2020-08-18 SURGERY — PHACOEMULSIFICATION, CATARACT, WITH IOL INSERTION
Anesthesia: Topical | Laterality: Right

## 2020-09-23 ENCOUNTER — Other Ambulatory Visit: Payer: Self-pay

## 2020-09-23 ENCOUNTER — Encounter: Payer: Self-pay | Admitting: Ophthalmology

## 2020-09-27 ENCOUNTER — Other Ambulatory Visit
Admission: RE | Admit: 2020-09-27 | Discharge: 2020-09-27 | Disposition: A | Discharge status: Home / Self Care | Payer: Medicare HMO | Source: Ambulatory Visit | Attending: Ophthalmology | Admitting: Ophthalmology

## 2020-09-27 ENCOUNTER — Other Ambulatory Visit: Payer: Self-pay

## 2020-09-27 DIAGNOSIS — Z01812 Encounter for preprocedural laboratory examination: Secondary | ICD-10-CM | POA: Insufficient documentation

## 2020-09-27 DIAGNOSIS — Z20822 Contact with and (suspected) exposure to covid-19: Secondary | ICD-10-CM | POA: Diagnosis not present

## 2020-09-27 NOTE — Discharge Instructions (Signed)

## 2020-09-28 LAB — SARS CORONAVIRUS 2 (TAT 6-24 HRS): SARS Coronavirus 2: NEGATIVE

## 2020-09-29 ENCOUNTER — Encounter
Admission: RE | Disposition: A | Discharge status: Home / Self Care | Payer: Self-pay | Source: Home / Self Care | Attending: Ophthalmology

## 2020-09-29 ENCOUNTER — Other Ambulatory Visit: Payer: Self-pay

## 2020-09-29 ENCOUNTER — Ambulatory Visit
Admission: RE | Admit: 2020-09-29 | Discharge: 2020-09-29 | Disposition: A | Discharge status: Home / Self Care | Payer: Medicare HMO | Attending: Ophthalmology | Admitting: Ophthalmology

## 2020-09-29 ENCOUNTER — Ambulatory Visit: Payer: Medicare HMO | Admitting: Anesthesiology

## 2020-09-29 ENCOUNTER — Encounter: Payer: Self-pay | Admitting: Ophthalmology

## 2020-09-29 DIAGNOSIS — Z79899 Other long term (current) drug therapy: Secondary | ICD-10-CM | POA: Insufficient documentation

## 2020-09-29 DIAGNOSIS — Z888 Allergy status to other drugs, medicaments and biological substances status: Secondary | ICD-10-CM | POA: Diagnosis not present

## 2020-09-29 DIAGNOSIS — H2511 Age-related nuclear cataract, right eye: Secondary | ICD-10-CM | POA: Diagnosis not present

## 2020-09-29 DIAGNOSIS — Z809 Family history of malignant neoplasm, unspecified: Secondary | ICD-10-CM | POA: Diagnosis not present

## 2020-09-29 DIAGNOSIS — Z886 Allergy status to analgesic agent status: Secondary | ICD-10-CM | POA: Diagnosis not present

## 2020-09-29 HISTORY — PX: CATARACT EXTRACTION W/PHACO: SHX586

## 2020-09-29 HISTORY — DX: Nonrheumatic mitral (valve) prolapse: I34.1

## 2020-09-29 SURGERY — PHACOEMULSIFICATION, CATARACT, WITH IOL INSERTION
Anesthesia: Monitor Anesthesia Care | Site: Eye | Laterality: Right

## 2020-09-29 MED ORDER — MOXIFLOXACIN HCL 0.5 % OP SOLN
OPHTHALMIC | Status: DC | PRN
  Administered 2020-09-29: 0.2 mL via OPHTHALMIC

## 2020-09-29 MED ORDER — ARMC OPHTHALMIC DILATING DROPS
1.0000 "application " | OPHTHALMIC | Status: DC | PRN
  Administered 2020-09-29 (×3): 1 via OPHTHALMIC

## 2020-09-29 MED ORDER — EPINEPHRINE PF 1 MG/ML IJ SOLN
INTRAOCULAR | Status: DC | PRN
  Administered 2020-09-29: 42 mL via OPHTHALMIC

## 2020-09-29 MED ORDER — LIDOCAINE HCL (PF) 2 % IJ SOLN
INTRAOCULAR | Status: DC | PRN
  Administered 2020-09-29: 2 mL

## 2020-09-29 MED ORDER — MIDAZOLAM HCL 2 MG/2ML IJ SOLN
INTRAMUSCULAR | Status: DC | PRN
  Administered 2020-09-29 (×2): 1 mg via INTRAVENOUS

## 2020-09-29 MED ORDER — FENTANYL CITRATE (PF) 100 MCG/2ML IJ SOLN
INTRAMUSCULAR | Status: DC | PRN
  Administered 2020-09-29: 50 ug via INTRAVENOUS

## 2020-09-29 MED ORDER — TETRACAINE HCL 0.5 % OP SOLN
1.0000 [drp] | OPHTHALMIC | Status: DC | PRN
  Administered 2020-09-29 (×3): 1 [drp] via OPHTHALMIC

## 2020-09-29 MED ORDER — BRIMONIDINE TARTRATE-TIMOLOL 0.2-0.5 % OP SOLN
OPHTHALMIC | Status: DC | PRN
  Administered 2020-09-29: 1 [drp] via OPHTHALMIC

## 2020-09-29 MED ORDER — NA HYALUR & NA CHOND-NA HYALUR 0.4-0.35 ML IO KIT
PACK | INTRAOCULAR | Status: DC | PRN
  Administered 2020-09-29: 1 mL via INTRAOCULAR

## 2020-09-29 SURGICAL SUPPLY — 19 items
CANNULA ANT/CHMB 27G (MISCELLANEOUS) ×1 IMPLANT
CANNULA ANT/CHMB 27GA (MISCELLANEOUS) ×2 IMPLANT
GLOVE EXAM LX STRL 7.5 (GLOVE) ×2 IMPLANT
GLOVE SURG TRIUMPH 8.0 PF LTX (GLOVE) ×2 IMPLANT
GOWN STRL REUS W/ TWL LRG LVL3 (GOWN DISPOSABLE) ×2 IMPLANT
GOWN STRL REUS W/TWL LRG LVL3 (GOWN DISPOSABLE) ×4
LENS IOL TECNIS EYHANCE 20.5 (Intraocular Lens) ×2 IMPLANT
MARKER SKIN DUAL TIP RULER LAB (MISCELLANEOUS) ×2 IMPLANT
NEEDLE CAPSULORHEX 25GA (NEEDLE) ×2 IMPLANT
NEEDLE FILTER BLUNT 18X 1/2SAF (NEEDLE) ×2
NEEDLE FILTER BLUNT 18X1 1/2 (NEEDLE) ×2 IMPLANT
PACK CATARACT BRASINGTON (MISCELLANEOUS) ×2 IMPLANT
PACK EYE AFTER SURG (MISCELLANEOUS) ×2 IMPLANT
PACK OPTHALMIC (MISCELLANEOUS) ×2 IMPLANT
SOLUTION OPHTHALMIC SALT (MISCELLANEOUS) ×2 IMPLANT
SYR 3ML LL SCALE MARK (SYRINGE) ×4 IMPLANT
SYR TB 1ML LUER SLIP (SYRINGE) ×2 IMPLANT
WATER STERILE IRR 250ML POUR (IV SOLUTION) ×2 IMPLANT
WIPE NON LINTING 3.25X3.25 (MISCELLANEOUS) ×2 IMPLANT

## 2020-09-29 NOTE — Transfer of Care (Signed)
Immediate Anesthesia Transfer of Care Note  Patient: Rebekah Shaw  Procedure(s) Performed: CATARACT EXTRACTION PHACO AND INTRAOCULAR LENS PLACEMENT (IOC) RIGHT 4.51 00:41.1 10.9% (Right Eye)  Patient Location: PACU  Anesthesia Type: MAC  Level of Consciousness: awake, alert  and patient cooperative  Airway and Oxygen Therapy: Patient Spontanous Breathing and Patient connected to supplemental oxygen  Post-op Assessment: Post-op Vital signs reviewed, Patient's Cardiovascular Status Stable, Respiratory Function Stable, Patent Airway and No signs of Nausea or vomiting  Post-op Vital Signs: Reviewed and stable  Complications: No complications documented.

## 2020-09-29 NOTE — Anesthesia Procedure Notes (Signed)
Procedure Name: MAC Date/Time: 09/29/2020 11:14 AM Performed by: Vanetta Shawl, CRNA Pre-anesthesia Checklist: Patient identified, Emergency Drugs available, Suction available, Timeout performed and Patient being monitored Patient Re-evaluated:Patient Re-evaluated prior to induction Oxygen Delivery Method: Nasal cannula Placement Confirmation: positive ETCO2

## 2020-09-29 NOTE — Anesthesia Preprocedure Evaluation (Signed)
Anesthesia Evaluation  Patient identified by MRN, date of birth, ID band Patient awake    Reviewed: Allergy & Precautions, NPO status   Airway Mallampati: II  TM Distance: >3 FB     Dental   Pulmonary    Pulmonary exam normal        Cardiovascular hypertension, + Valvular Problems/Murmurs MVP  Rhythm:Regular Rate:Normal  HLD   Neuro/Psych    GI/Hepatic   Endo/Other    Renal/GU      Musculoskeletal  (+) Arthritis , Osteoporosis   Abdominal   Peds  Hematology   Anesthesia Other Findings   Reproductive/Obstetrics                             Anesthesia Physical Anesthesia Plan  ASA: II  Anesthesia Plan: MAC   Post-op Pain Management:    Induction: Intravenous  PONV Risk Score and Plan: TIVA, Midazolam and Treatment may vary due to age or medical condition  Airway Management Planned: Natural Airway and Nasal Cannula  Additional Equipment:   Intra-op Plan:   Post-operative Plan:   Informed Consent: I have reviewed the patients History and Physical, chart, labs and discussed the procedure including the risks, benefits and alternatives for the proposed anesthesia with the patient or authorized representative who has indicated his/her understanding and acceptance.       Plan Discussed with: CRNA  Anesthesia Plan Comments:         Anesthesia Quick Evaluation

## 2020-09-29 NOTE — H&P (Signed)
[facility]   Primary Care Physician:  Lynnea Ferrier, MD Ophthalmologist: Dr. Lockie Mola  Pre-Procedure History & Physical: HPI:  Rebekah Shaw is a 70 y.o. female here for ophthalmic surgery.   Past Medical History:  Diagnosis Date  . Allergy   . Arthritis 2019   osteoarthritis, hips bilat  . Hyperlipidemia   . Mitral valve prolapse   . Osteoporosis    right wrist, hips bilat    Past Surgical History:  Procedure Laterality Date  . APPENDECTOMY    . FOOT SURGERY    . TONSILLECTOMY AND ADENOIDECTOMY    . VASCULAR SURGERY Bilateral 2009   legs  . VEIN SURGERY Bilateral    Endoscopinc laser vein catheterization    Prior to Admission medications   Medication Sig Start Date End Date Taking? Authorizing Provider  acetaminophen (TYLENOL) 500 MG tablet Take 500 mg by mouth every 6 (six) hours as needed.   Yes [provider]  BIOTIN 5000 PO Take by mouth.   Yes [provider]  hydrochlorothiazide (HYDRODIURIL) 12.5 MG tablet Take by mouth. 12/10/18 09/29/20 Yes [provider]  ibuprofen (ADVIL,MOTRIN) 800 MG tablet Take 800 mg by mouth every 8 (eight) hours as needed.   Yes [provider]  loratadine (CLARITIN) 10 MG tablet Take 10 mg by mouth daily.   Yes [provider]  magnesium oxide (MAG-OX) 400 MG tablet Take by mouth.   Yes [provider]  multivitamin-iron-minerals-folic acid (CENTRUM) chewable tablet Chew 1 tablet by mouth daily.   Yes [provider]  Omega-3 Fatty Acids (FISH OIL) 1200 MG CAPS Take by mouth.   Yes [provider]  PREDNISOLONE ACETATE OP Apply to eye in the morning and at bedtime.   Yes [provider]  VITAMIN D, CHOLECALCIFEROL, PO Take by mouth.   Yes [provider]  diclofenac sodium (VOLTAREN) 1 % GEL Apply topically 4 (four) times daily. Patient not taking: Reported on 09/23/2020    [provider]  diphenhydrAMINE-APAP,  sleep, (TYLENOL PM EXTRA STRENGTH) 50-1000 MG/30ML LIQD Take by mouth. Patient not taking: Reported on 09/23/2020    [provider]    Allergies as of 08/02/2020 - Review Complete 03/24/2020  Allergen Reaction Noted  . Prednisone Diarrhea and Nausea And Vomiting 10/19/2015  . Atorvastatin Other (See Comments) 01/28/2014  . Ezetimibe Other (See Comments) 01/28/2014  . Ibandronic acid Other (See Comments) 01/28/2014  . Pravastatin Other (See Comments) 03/13/2017  . Rosuvastatin Other (See Comments) 01/28/2014  . Simvastatin Other (See Comments) 06/03/2014  . Meloxicam Rash and Swelling 12/15/2016    Family History  Problem Relation Age of Onset  . Cancer Father   . Breast cancer Neg Hx     Social History   Socioeconomic History  . Marital status: Single    Spouse name: Not on file  . Number of children: Not on file  . Years of education: Not on file  . Highest education level: Not on file  Occupational History  . Not on file  Tobacco Use  . Smoking status: Never Smoker  . Smokeless tobacco: Never Used  Vaping Use  . Vaping Use: Never used  Substance and Sexual Activity  . Alcohol use: Never  . Drug use: Never  . Sexual activity: Not on file  Other Topics Concern  . Not on file  Social History Narrative  . Not on file   Social Determinants of Health   Financial Resource Strain: Not  on file  Food Insecurity: Not on file  Transportation Needs: Not on file  Physical Activity: Not on file  Stress: Not on file  Social Connections: Not on file  Intimate Partner Violence: Not on file    Review of Systems: See HPI, otherwise negative ROS  Physical Exam: Ht 5' (1.524 m)   Wt 68 kg   BMI 29.29 kg/m  General:   Alert,  pleasant and cooperative in NAD Head:  Normocephalic and atraumatic. Lungs:  Clear to auscultation.    Heart:  Regular rate and rhythm.   Impression/Plan: Rebekah Shaw is here for ophthalmic surgery.  Risks, benefits, limitations, and  alternatives regarding ophthalmic surgery have been reviewed with the patient.  Questions have been answered.  All parties agreeable.   Lockie Mola, MD  09/29/2020, 10:46 AM

## 2020-09-29 NOTE — Anesthesia Postprocedure Evaluation (Signed)
Anesthesia Post Note  Patient: Rebekah Shaw  Procedure(s) Performed: CATARACT EXTRACTION PHACO AND INTRAOCULAR LENS PLACEMENT (IOC) RIGHT 4.51 00:41.1 10.9% (Right Eye)     Patient location during evaluation: PACU Anesthesia Type: MAC Level of consciousness: awake Pain management: pain level controlled Vital Signs Assessment: post-procedure vital signs reviewed and stable Respiratory status: respiratory function stable Cardiovascular status: stable Postop Assessment: no apparent nausea or vomiting Anesthetic complications: no   No complications documented.  Veda Canning

## 2020-09-29 NOTE — Op Note (Signed)
  LOCATION:  [facility]   PREOPERATIVE DIAGNOSIS:    Nuclear sclerotic cataract right eye. H25.11   POSTOPERATIVE DIAGNOSIS:  Nuclear sclerotic cataract right eye.     PROCEDURE:  Phacoemusification with posterior chamber intraocular lens placement of the right eye   ULTRASOUND TIME: Procedure(s): CATARACT EXTRACTION PHACO AND INTRAOCULAR LENS PLACEMENT (IOC) RIGHT 4.51 00:41.1 10.9% (Right)  LENS:   Implant Name Type Inv. Item Serial No. Manufacturer Lot No. LRB No. Used Action  LENS IOL TECNIS EYHANCE 20.5 - [id number] Intraocular Lens LENS IOL TECNIS EYHANCE 20.5 [id number] JOHNSON   Right 1 Implanted         SURGEON:  Deirdre Evener, MD   ANESTHESIA:  Topical with tetracaine drops and 2% Xylocaine jelly, augmented with 1% preservative-free intracameral lidocaine.    COMPLICATIONS:  None.   DESCRIPTION OF PROCEDURE:  The patient was identified in the holding room and transported to the operating room and placed in the supine position under the operating microscope.  The right eye was identified as the operative eye and it was prepped and draped in the usual sterile ophthalmic fashion.   A 1 millimeter clear-corneal paracentesis was made at the 12:00 position.  0.5 ml of preservative-free 1% lidocaine was injected into the anterior chamber. The anterior chamber was filled with Viscoat viscoelastic.  A 2.4 millimeter keratome was used to make a near-clear corneal incision at the 9:00 position.  A curvilinear capsulorrhexis was made with a cystotome and capsulorrhexis forceps.  Balanced salt solution was used to hydrodissect and hydrodelineate the nucleus.   Phacoemulsification was then used in stop and chop fashion to remove the lens nucleus and epinucleus.  The remaining cortex was then removed using the irrigation and aspiration handpiece. Provisc was then placed into the capsular bag to distend it for lens placement.  A lens was then injected into the capsular  bag.  The remaining viscoelastic was aspirated.   Wounds were hydrated with balanced salt solution.  The anterior chamber was inflated to a physiologic pressure with balanced salt solution.  No wound leaks were noted. Vigamox 0.2 ml of a 1mg  per ml solution was injected into the anterior chamber for a dose of 0.2 mg of intracameral antibiotic at the completion of the case.   Timolol and Brimonidine drops were applied to the eye.  The patient was taken to the recovery room in stable condition without complications of anesthesia or surgery.   Jheremy Boger 09/29/2020, 11:29 AM

## 2020-09-30 ENCOUNTER — Encounter: Payer: Self-pay | Admitting: Ophthalmology

## 2020-10-05 ENCOUNTER — Encounter: Payer: Self-pay | Admitting: Ophthalmology

## 2020-10-05 ENCOUNTER — Other Ambulatory Visit: Payer: Self-pay

## 2020-10-11 ENCOUNTER — Other Ambulatory Visit: Admission: RE | Admit: 2020-10-11 | Payer: Medicare HMO | Source: Ambulatory Visit

## 2020-10-12 NOTE — Discharge Instructions (Signed)

## 2020-10-13 ENCOUNTER — Ambulatory Visit
Admission: RE | Admit: 2020-10-13 | Discharge: 2020-10-13 | Disposition: A | Discharge status: Home / Self Care | Payer: Medicare HMO | Source: Ambulatory Visit | Attending: Ophthalmology | Admitting: Ophthalmology

## 2020-10-13 ENCOUNTER — Ambulatory Visit: Payer: Medicare HMO | Admitting: Anesthesiology

## 2020-10-13 ENCOUNTER — Encounter
Admission: RE | Disposition: A | Discharge status: Home / Self Care | Payer: Self-pay | Source: Ambulatory Visit | Attending: Ophthalmology

## 2020-10-13 ENCOUNTER — Encounter: Payer: Self-pay | Admitting: Ophthalmology

## 2020-10-13 ENCOUNTER — Other Ambulatory Visit: Payer: Self-pay

## 2020-10-13 DIAGNOSIS — Z79899 Other long term (current) drug therapy: Secondary | ICD-10-CM | POA: Insufficient documentation

## 2020-10-13 DIAGNOSIS — Z880 Allergy status to penicillin: Secondary | ICD-10-CM | POA: Insufficient documentation

## 2020-10-13 DIAGNOSIS — H2512 Age-related nuclear cataract, left eye: Secondary | ICD-10-CM | POA: Diagnosis not present

## 2020-10-13 DIAGNOSIS — Z886 Allergy status to analgesic agent status: Secondary | ICD-10-CM | POA: Diagnosis not present

## 2020-10-13 DIAGNOSIS — Z888 Allergy status to other drugs, medicaments and biological substances status: Secondary | ICD-10-CM | POA: Insufficient documentation

## 2020-10-13 HISTORY — PX: CATARACT EXTRACTION W/PHACO: SHX586

## 2020-10-13 SURGERY — PHACOEMULSIFICATION, CATARACT, WITH IOL INSERTION
Anesthesia: Monitor Anesthesia Care | Site: Eye | Laterality: Left

## 2020-10-13 MED ORDER — EPINEPHRINE PF 1 MG/ML IJ SOLN
INTRAOCULAR | Status: DC | PRN
  Administered 2020-10-13: 59 mL via OPHTHALMIC

## 2020-10-13 MED ORDER — NA HYALUR & NA CHOND-NA HYALUR 0.4-0.35 ML IO KIT
PACK | INTRAOCULAR | Status: DC | PRN
  Administered 2020-10-13: 1 mL via INTRAOCULAR

## 2020-10-13 MED ORDER — TETRACAINE HCL 0.5 % OP SOLN
1.0000 [drp] | OPHTHALMIC | Status: DC | PRN
  Administered 2020-10-13 (×3): 1 [drp] via OPHTHALMIC

## 2020-10-13 MED ORDER — FENTANYL CITRATE (PF) 100 MCG/2ML IJ SOLN
INTRAMUSCULAR | Status: DC | PRN
  Administered 2020-10-13 (×2): 25 ug via INTRAVENOUS
  Administered 2020-10-13: 50 ug via INTRAVENOUS

## 2020-10-13 MED ORDER — MOXIFLOXACIN HCL 0.5 % OP SOLN
OPHTHALMIC | Status: DC | PRN
  Administered 2020-10-13: 0.2 mL via OPHTHALMIC

## 2020-10-13 MED ORDER — BRIMONIDINE TARTRATE-TIMOLOL 0.2-0.5 % OP SOLN
OPHTHALMIC | Status: DC | PRN
  Administered 2020-10-13: 1 [drp] via OPHTHALMIC

## 2020-10-13 MED ORDER — MIDAZOLAM HCL 2 MG/2ML IJ SOLN: INTRAMUSCULAR | Status: DC | PRN

## 2020-10-13 MED ORDER — LIDOCAINE HCL (PF) 2 % IJ SOLN
INTRAOCULAR | Status: DC | PRN
  Administered 2020-10-13: 1 mL

## 2020-10-13 MED ORDER — MIDAZOLAM HCL 2 MG/2ML IJ SOLN
INTRAMUSCULAR | Status: DC | PRN
  Administered 2020-10-13 (×2): 1 mg via INTRAVENOUS

## 2020-10-13 MED ORDER — ARMC OPHTHALMIC DILATING DROPS
1.0000 "application " | OPHTHALMIC | Status: DC | PRN
  Administered 2020-10-13 (×3): 1 via OPHTHALMIC

## 2020-10-13 SURGICAL SUPPLY — 23 items
CANNULA ANT/CHMB 27GA (MISCELLANEOUS) ×2 IMPLANT
GLOVE SURG TRIUMPH 8.0 PF LTX (GLOVE) ×6 IMPLANT
GOWN STRL REUS W/ TWL LRG LVL3 (GOWN DISPOSABLE) ×2 IMPLANT
GOWN STRL REUS W/TWL LRG LVL3 (GOWN DISPOSABLE) ×4
LENS IOL TECNIS EYHANCE 21.5 (Intraocular Lens) ×2 IMPLANT
MARKER SKIN DUAL TIP RULER LAB (MISCELLANEOUS) ×2 IMPLANT
NDL RETROBULBAR .5 NSTRL (NEEDLE) IMPLANT
NEEDLE CAPSULORHEX 25GA (NEEDLE) ×2 IMPLANT
NEEDLE FILTER BLUNT 18X 1/2SAF (NEEDLE) ×2
NEEDLE FILTER BLUNT 18X1 1/2 (NEEDLE) ×2 IMPLANT
PACK CATARACT BRASINGTON (MISCELLANEOUS) ×2 IMPLANT
PACK EYE AFTER SURG (MISCELLANEOUS) ×2 IMPLANT
PACK OPTHALMIC (MISCELLANEOUS) ×2 IMPLANT
RING MALYGIN 7.0 (MISCELLANEOUS) IMPLANT
SOLUTION OPHTHALMIC SALT (MISCELLANEOUS) ×2 IMPLANT
SUT ETHILON 10-0 CS-B-6CS-B-6 (SUTURE)
SUT VICRYL  9 0 (SUTURE)
SUT VICRYL 9 0 (SUTURE) IMPLANT
SUTURE EHLN 10-0 CS-B-6CS-B-6 (SUTURE) IMPLANT
SYR 3ML LL SCALE MARK (SYRINGE) ×4 IMPLANT
SYR TB 1ML LUER SLIP (SYRINGE) ×2 IMPLANT
WATER STERILE IRR 250ML POUR (IV SOLUTION) ×2 IMPLANT
WIPE NON LINTING 3.25X3.25 (MISCELLANEOUS) ×2 IMPLANT

## 2020-10-13 NOTE — Transfer of Care (Signed)
Immediate Anesthesia Transfer of Care Note  Patient: Rebekah Shaw  Procedure(s) Performed: CATARACT EXTRACTION PHACO AND INTRAOCULAR LENS PLACEMENT (IOC) LEFT (Left Eye)  Patient Location: PACU  Anesthesia Type: MAC  Level of Consciousness: awake, alert  and patient cooperative  Airway and Oxygen Therapy: Patient Spontanous Breathing and Patient connected to supplemental oxygen  Post-op Assessment: Post-op Vital signs reviewed, Patient's Cardiovascular Status Stable, Respiratory Function Stable, Patent Airway and No signs of Nausea or vomiting  Post-op Vital Signs: Reviewed and stable  Complications: No complications documented.

## 2020-10-13 NOTE — H&P (Signed)
[facility]   Primary Care Physician:  Lynnea Ferrier, MD Ophthalmologist: Dr. Lockie Mola  Pre-Procedure History & Physical: HPI:  Rebekah Shaw is a 70 y.o. female here for ophthalmic surgery.   Past Medical History:  Diagnosis Date  . Allergy   . Arthritis 2019   osteoarthritis, hips bilat  . Hyperlipidemia   . Mitral valve prolapse   . Osteoporosis    right wrist, hips bilat    Past Surgical History:  Procedure Laterality Date  . APPENDECTOMY    . CATARACT EXTRACTION W/PHACO Right 09/29/2020   Procedure: CATARACT EXTRACTION PHACO AND INTRAOCULAR LENS PLACEMENT (IOC) RIGHT 4.51 00:41.1 10.9%;  Surgeon: Lockie Mola, MD;  Location: [facility];  Service: Ophthalmology;  Laterality: Right;  . FOOT SURGERY    . TONSILLECTOMY AND ADENOIDECTOMY    . VASCULAR SURGERY Bilateral 2009   legs  . VEIN SURGERY Bilateral    Endoscopinc laser vein catheterization    Prior to Admission medications   Medication Sig Start Date End Date Taking? Authorizing Provider  acetaminophen (TYLENOL) 500 MG tablet Take 500 mg by mouth every 6 (six) hours as needed.   Yes [provider]  BIOTIN 5000 PO Take by mouth.   Yes [provider]  hydrochlorothiazide (HYDRODIURIL) 12.5 MG tablet Take by mouth. 12/10/18 10/13/20 Yes [provider]  ibuprofen (ADVIL,MOTRIN) 800 MG tablet Take 800 mg by mouth every 8 (eight) hours as needed.   Yes [provider]  loratadine (CLARITIN) 10 MG tablet Take 10 mg by mouth daily.   Yes [provider]  magnesium oxide (MAG-OX) 400 MG tablet Take by mouth.   Yes [provider]  multivitamin-iron-minerals-folic acid (CENTRUM) chewable tablet Chew 1 tablet by mouth daily.   Yes [provider]  Omega-3 Fatty Acids (FISH OIL) 1200 MG CAPS Take by mouth.   Yes [provider]  PREDNISOLONE ACETATE OP Apply to eye in the morning and at bedtime.   Yes [provider]  VITAMIN D, CHOLECALCIFEROL, PO Take by mouth.   Yes [provider]  diclofenac sodium (VOLTAREN) 1 % GEL Apply topically 4 (four) times daily. Patient not taking: Reported on 09/23/2020    [provider]  diphenhydrAMINE-APAP, sleep, (TYLENOL PM EXTRA STRENGTH) 50-1000 MG/30ML LIQD Take by mouth. Patient not taking: Reported on 09/23/2020    [provider]    Allergies as of 10/04/2020 - Review Complete 09/29/2020  Allergen Reaction Noted  . Prednisone Diarrhea and Nausea And Vomiting 10/19/2015  . Atorvastatin Other (See Comments) 01/28/2014  . Ezetimibe Other (See Comments) 01/28/2014  . Ibandronic acid Other (See Comments) 01/28/2014  . Penicillins  09/23/2020  . Pravastatin Other (See Comments) 03/13/2017  . Rosuvastatin Other (See Comments) 01/28/2014  . Simvastatin Other (See Comments) 06/03/2014  . Meloxicam Rash and Swelling 12/15/2016    Family History  Problem Relation Age of Onset  . Cancer Father   . Breast cancer Neg Hx     Social History   Socioeconomic History  . Marital status: Single    Spouse name: Not on file  . Number of children: Not on file  . Years of education: Not on file  . Highest education level: Not on file  Occupational History  . Not on file  Tobacco Use  . Smoking status: Never Smoker  . Smokeless tobacco: Never Used  Vaping Use  . Vaping Use: Never used  Substance and Sexual Activity  . Alcohol use:  Never  . Drug use: Never  . Sexual activity: Not on file  Other Topics Concern  . Not on file  Social History Narrative  . Not on file   Social Determinants of Health   Financial Resource Strain: Not on file  Food Insecurity: Not on file  Transportation Needs: Not on file  Physical Activity: Not on file  Stress: Not on file  Social Connections: Not on file  Intimate Partner Violence: Not on file    Review of Systems: See HPI, otherwise negative ROS  Physical Exam: BP (!) 154/94    Pulse 81   Temp (!) 97.2 F (36.2 C)   Wt 69.4 kg   SpO2 99%   BMI 29.88 kg/m  General:   Alert,  pleasant and cooperative in NAD Head:  Normocephalic and atraumatic. Lungs:  Clear to auscultation.    Heart:  Regular rate and rhythm.   Impression/Plan: Rebekah Shaw is here for ophthalmic surgery.  Risks, benefits, limitations, and alternatives regarding ophthalmic surgery have been reviewed with the patient.  Questions have been answered.  All parties agreeable.   Lockie Mola, MD  10/13/2020, 8:46 AM

## 2020-10-13 NOTE — Anesthesia Preprocedure Evaluation (Signed)
Anesthesia Evaluation    Airway Mallampati: III  TM Distance: >3 FB Neck ROM: Full    Dental no notable dental hx.    Pulmonary    Pulmonary exam normal        Cardiovascular hypertension, Pt. on medications Normal cardiovascular exam+ Valvular Problems/Murmurs (mitral valve prolapse)      Neuro/Psych    GI/Hepatic   Endo/Other    Renal/GU      Musculoskeletal   Abdominal   Peds  Hematology   Anesthesia Other Findings   Reproductive/Obstetrics                             Anesthesia Physical Anesthesia Plan  ASA: II  Anesthesia Plan: MAC   Post-op Pain Management:    Induction:   PONV Risk Score and Plan: Midazolam, TIVA and Treatment may vary due to age or medical condition  Airway Management Planned: Nasal Cannula and Natural Airway  Additional Equipment: None  Intra-op Plan:   Post-operative Plan:   Informed Consent: I have reviewed the patients History and Physical, chart, labs and discussed the procedure including the risks, benefits and alternatives for the proposed anesthesia with the patient or authorized representative who has indicated his/her understanding and acceptance.       Plan Discussed with: CRNA  Anesthesia Plan Comments:         Anesthesia Quick Evaluation

## 2020-10-13 NOTE — Op Note (Signed)
  OPERATIVE NOTE  Rebekah Shaw [id number] 10/13/2020   PREOPERATIVE DIAGNOSIS:  Nuclear sclerotic cataract left eye. H25.12   POSTOPERATIVE DIAGNOSIS:    Nuclear sclerotic cataract left eye.     PROCEDURE:  Phacoemusification with posterior chamber intraocular lens placement of the left eye  Ultrasound time: Procedure(s) with comments: CATARACT EXTRACTION PHACO AND INTRAOCULAR LENS PLACEMENT (IOC) LEFT (Left) - 1.66 0:31.7 5.2%  LENS:   Implant Name Type Inv. Item Serial No. Manufacturer Lot No. LRB No. Used Action  LENS IOL TECNIS EYHANCE 21.5 - [id number] Intraocular Lens LENS IOL TECNIS EYHANCE 21.5 [id number] JOHNSON   Left 1 Implanted      SURGEON:  Deirdre Evener, MD   ANESTHESIA:  Topical with tetracaine drops and 2% Xylocaine jelly, augmented with 1% preservative-free intracameral lidocaine.    COMPLICATIONS:  None.   DESCRIPTION OF PROCEDURE:  The patient was identified in the holding room and transported to the operating room and placed in the supine position under the operating microscope.  The left eye was identified as the operative eye and it was prepped and draped in the usual sterile ophthalmic fashion.   A 1 millimeter clear-corneal paracentesis was made at the 1:30 position.  0.5 ml of preservative-free 1% lidocaine was injected into the anterior chamber.  The anterior chamber was filled with Viscoat viscoelastic.  A 2.4 millimeter keratome was used to make a near-clear corneal incision at the 10:30 position.  .  A curvilinear capsulorrhexis was made with a cystotome and capsulorrhexis forceps.  Balanced salt solution was used to hydrodissect and hydrodelineate the nucleus.   Phacoemulsification was then used in stop and chop fashion to remove the lens nucleus and epinucleus.  The remaining cortex was then removed using the irrigation and aspiration handpiece. Provisc was then placed into the capsular bag to distend it for lens placement.  A lens was then  injected into the capsular bag.  The remaining viscoelastic was aspirated.   Wounds were hydrated with balanced salt solution.  The anterior chamber was inflated to a physiologic pressure with balanced salt solution.  No wound leaks were noted. Vigamox 0.2 ml of a 1mg  per ml solution was injected into the anterior chamber for a dose of 0.2 mg of intracameral antibiotic at the completion of the case.   Timolol and Brimonidine drops were applied to the eye.  The patient was taken to the recovery room in stable condition without complications of anesthesia or surgery.  Rebekah Shaw 10/13/2020, 10:12 AM

## 2020-10-13 NOTE — Anesthesia Postprocedure Evaluation (Signed)
Anesthesia Post Note  Patient: Rebekah Shaw  Procedure(s) Performed: CATARACT EXTRACTION PHACO AND INTRAOCULAR LENS PLACEMENT (IOC) LEFT (Left Eye)     Patient location during evaluation: PACU Anesthesia Type: MAC Level of consciousness: awake and alert Pain management: pain level controlled Vital Signs Assessment: post-procedure vital signs reviewed and stable Respiratory status: spontaneous breathing Cardiovascular status: blood pressure returned to baseline Postop Assessment: no apparent nausea or vomiting, adequate PO intake and no headache Anesthetic complications: no   No complications documented.  Adele Barthel Rodell Marrs

## 2020-10-13 NOTE — Anesthesia Procedure Notes (Signed)
Date/Time: 10/13/2020 9:56 AM Performed by: Lily Kocher, CRNA Pre-anesthesia Checklist: Patient identified, Emergency Drugs available, Suction available, Patient being monitored and Timeout performed Patient Re-evaluated:Patient Re-evaluated prior to induction Oxygen Delivery Method: Nasal cannula Placement Confirmation: positive ETCO2

## 2020-10-14 ENCOUNTER — Encounter: Payer: Self-pay | Admitting: Ophthalmology

## 2021-04-01 ENCOUNTER — Other Ambulatory Visit: Payer: Self-pay | Admitting: Internal Medicine

## 2021-04-01 DIAGNOSIS — Z1231 Encounter for screening mammogram for malignant neoplasm of breast: Secondary | ICD-10-CM

## 2021-04-03 IMAGING — MG US BREAST CYST ASPIRATION 1ST CYST
1 series · 4 of 4 positions shown · non-contrast
Comparison: Previous exams.

CLINICAL DATA: 69-year-old female presenting for ultrasound-guided
aspiration of a right breast cyst. This was thought to be solid on
the diagnostic workup, and biopsy recommended, however during the
pre biopsy scanning this appear to be more cystic and therefore
aspiration was attempted first.

EXAM:
ULTRASOUND GUIDED LEFT BREAST CYST ASPIRATION

[Series 1: MG view · 0.06mm/px · 4 of 4 slices shown]
[im 1/4]
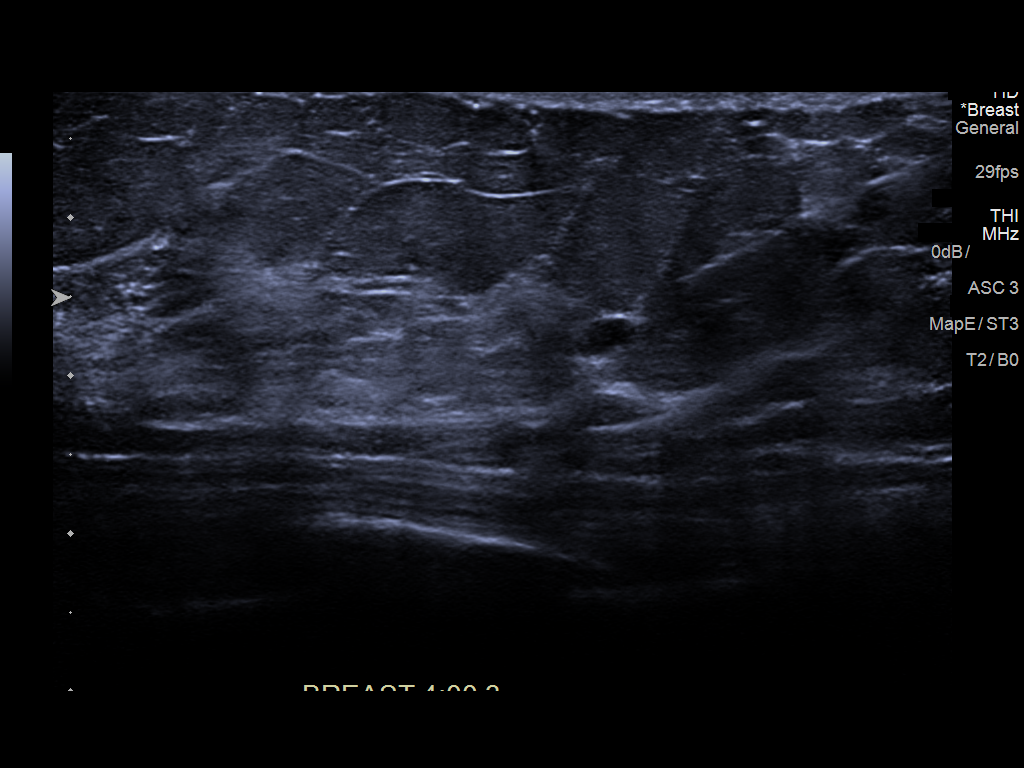
[im 2/4]
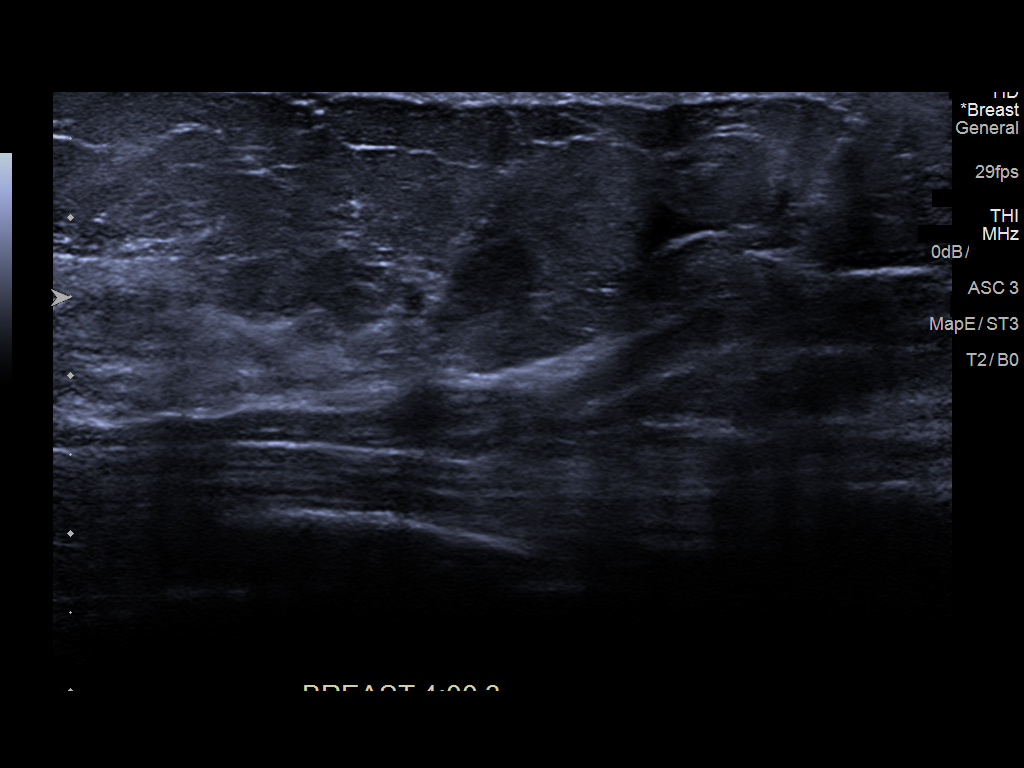
[im 3/4]
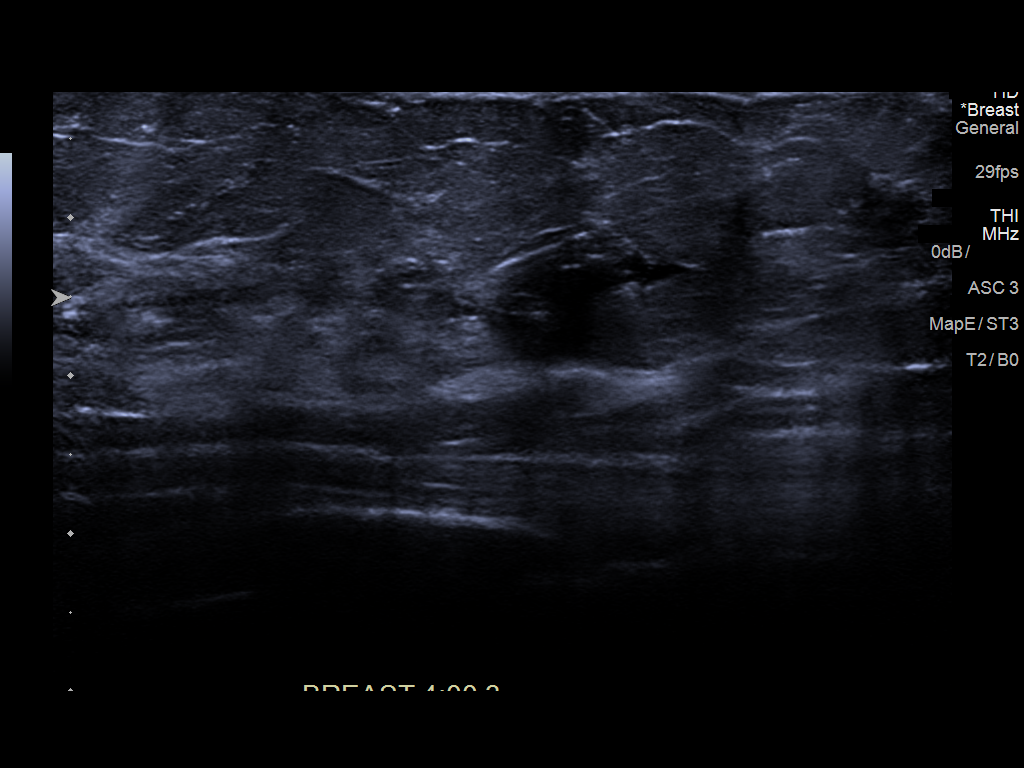
[im 4/4]
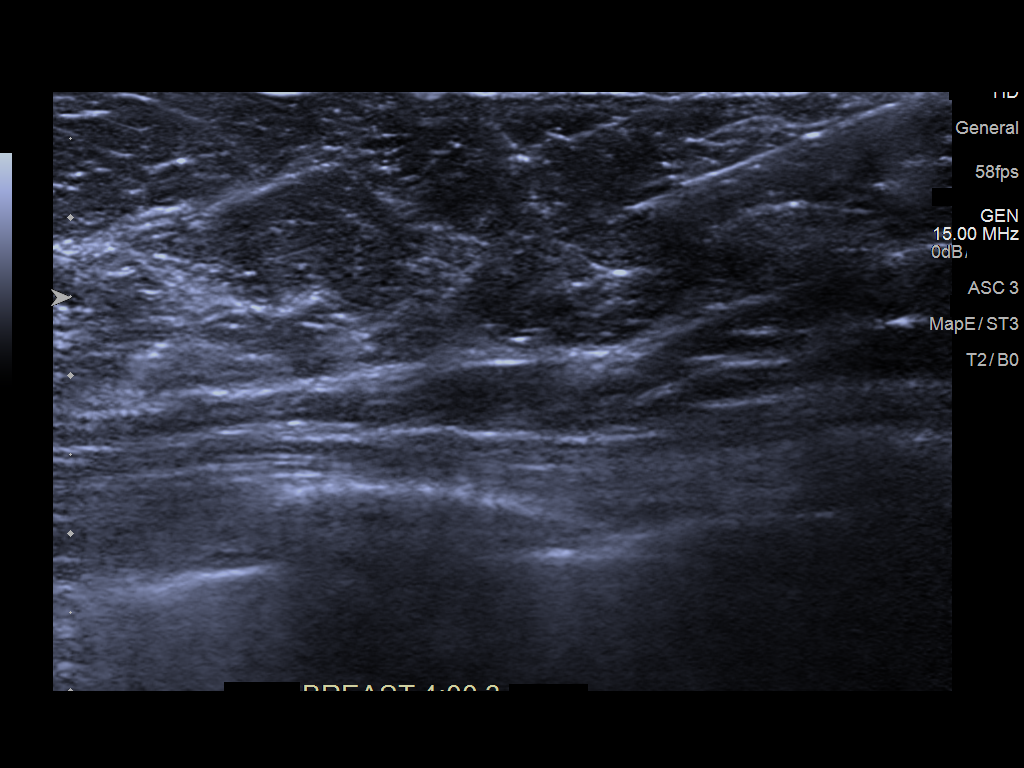

[4 of 4 positions shown; findings below may reference images not displayed]

PROCEDURE:
Using sterile technique, 1% lidocaine, under direct ultrasound
visualization, needle aspiration of a cyst in the left breast at 4
o'clock was performed. The cyst was aspirated to complete
resolution.
IMPRESSION: Ultrasound-guided aspiration of a cyst in the left breast at 4
o'clock. No apparent complications.

RECOMMENDATION:

Return to routine screening mammography is recommended. The patient
will be due for screening in Thursday December, 2020.

## 2021-04-20 ENCOUNTER — Other Ambulatory Visit: Payer: Self-pay

## 2021-04-20 ENCOUNTER — Ambulatory Visit
Admission: RE | Admit: 2021-04-20 | Discharge: 2021-04-20 | Disposition: A | Discharge status: Home / Self Care | Payer: Medicare HMO | Source: Ambulatory Visit | Attending: Internal Medicine | Admitting: Internal Medicine

## 2021-04-20 DIAGNOSIS — Z1231 Encounter for screening mammogram for malignant neoplasm of breast: Secondary | ICD-10-CM

## 2022-03-16 ENCOUNTER — Other Ambulatory Visit: Payer: Self-pay | Admitting: Internal Medicine

## 2022-03-16 DIAGNOSIS — Z1231 Encounter for screening mammogram for malignant neoplasm of breast: Secondary | ICD-10-CM

## 2022-04-21 ENCOUNTER — Ambulatory Visit
Admission: RE | Admit: 2022-04-21 | Discharge: 2022-04-21 | Disposition: A | Discharge status: Home / Self Care | Payer: Medicare HMO | Source: Ambulatory Visit | Attending: Internal Medicine | Admitting: Internal Medicine

## 2022-04-21 DIAGNOSIS — Z1231 Encounter for screening mammogram for malignant neoplasm of breast: Secondary | ICD-10-CM | POA: Insufficient documentation

## 2022-11-14 ENCOUNTER — Ambulatory Visit: Payer: Medicare HMO

## 2022-11-21 ENCOUNTER — Ambulatory Visit: Payer: Medicare HMO

## 2022-11-29 ENCOUNTER — Ambulatory Visit: Payer: Medicare HMO

## 2022-12-05 ENCOUNTER — Ambulatory Visit: Payer: Medicare HMO

## 2022-12-08 ENCOUNTER — Other Ambulatory Visit: Payer: Self-pay

## 2022-12-08 ENCOUNTER — Emergency Department
Admission: EM | Admit: 2022-12-08 | Discharge: 2022-12-08 | Disposition: A | Discharge status: Home / Self Care | Payer: Medicare HMO | Attending: Emergency Medicine | Admitting: Emergency Medicine

## 2022-12-08 ENCOUNTER — Emergency Department: Payer: Medicare HMO

## 2022-12-08 DIAGNOSIS — R0789 Other chest pain: Secondary | ICD-10-CM | POA: Insufficient documentation

## 2022-12-08 DIAGNOSIS — R079 Chest pain, unspecified: Secondary | ICD-10-CM

## 2022-12-08 LAB — BASIC METABOLIC PANEL
Anion gap: 12 (ref 5–15)
BUN: 25 mg/dL — ABNORMAL HIGH (ref 8–23)
CO2: 23 mmol/L (ref 22–32)
Calcium: 10.3 mg/dL (ref 8.9–10.3)
Chloride: 104 mmol/L (ref 98–111)
Creatinine, Ser: 0.73 mg/dL (ref 0.44–1.00)
GFR, Estimated: >60 mL/min (ref >60)
Glucose, Bld: 103 mg/dL — ABNORMAL HIGH (ref 70–99)
Potassium: 3.4 mmol/L — ABNORMAL LOW (ref 3.5–5.1)
Sodium: 139 mmol/L (ref 135–145)

## 2022-12-08 LAB — CBC
HCT: 46.9 % — ABNORMAL HIGH (ref 36.0–46.0)
Hemoglobin: 15.8 g/dL — ABNORMAL HIGH (ref 12.0–15.0)
MCH: 31.2 pg (ref 26.0–34.0)
MCHC: 33.7 g/dL (ref 30.0–36.0)
MCV: 92.7 fL (ref 80.0–100.0)
Platelets: 345 10*3/uL (ref 150–400)
RBC: 5.06 MIL/uL (ref 3.87–5.11)
RDW: 13.1 % (ref 11.5–15.5)
WBC: 10.8 10*3/uL — ABNORMAL HIGH (ref 4.0–10.5)
nRBC: 0 % (ref 0.0–0.2)

## 2022-12-08 LAB — TROPONIN I (HIGH SENSITIVITY)
Troponin I (High Sensitivity): 4 ng/L (ref <18)
Troponin I (High Sensitivity): 4 ng/L (ref <18)

## 2022-12-08 MED ORDER — IOHEXOL 300 MG/ML  SOLN
75.0000 mL | Freq: Once | INTRAMUSCULAR | Status: AC | PRN
  Administered 2022-12-08: 75 mL via INTRAVENOUS

## 2022-12-08 NOTE — ED Notes (Signed)
Pt taken to the room via wheelchair and attached to the monitor; primary RN made aware of the patients arrival.    

## 2022-12-08 NOTE — ED Triage Notes (Signed)
First Nurse: Pt here with cp from [facility]. Pt having indigestion and sent here for cardiac workup.

## 2022-12-08 NOTE — ED Provider Notes (Signed)
[facility] Provider Note    Event Date/Time   First MD Initiated Contact with Patient 12/08/22 2032     (approximate)  History   Chief Complaint: Chest Pain (X 3-4 months)  HPI  Rebekah Shaw is a 72 y.o. female with a past medical history of allergies, arthritis, hyperlipidemia, presents to the emergency department for chest pain.  According to the patient for the past 4 months or so she has been experiencing intermittent discomfort in her chest.  Also states at times when she swallows she will have to spit up some food although denies any sensation of food getting stuck or choking.  Patient denies any significant cough although does states seasonal allergies.  No fever.  No abdominal pain.  Patient went to [facility] clinic and they referred the patient to the emergency department.    Physical Exam   Triage Vital Signs: ED Triage Vitals  Enc Vitals Group     BP 12/08/22 1658 (!) 154/78     Pulse Rate 12/08/22 1658 90     Resp 12/08/22 1658 16     Temp 12/08/22 1658 98.9 F (37.2 C)     Temp Source 12/08/22 1658 Oral     SpO2 12/08/22 1658 94 %     Weight 12/08/22 1656 154 lb 5.2 oz (70 kg)     Height 12/08/22 1656 5' (1.524 m)     Head Circumference --      Peak Flow --      Pain Score 12/08/22 1656 2     Pain Loc --      Pain Edu? --      Excl. in GC? --     Most recent vital signs: Vitals:   12/08/22 1658 12/08/22 2002  BP: (!) 154/78 (!) 151/91  Pulse: 90 94  Resp: 16 13  Temp: 98.9 F (37.2 C) 98.8 F (37.1 C)  SpO2: 94% 97%    General: Awake, no distress.  CV:  Good peripheral perfusion.  Regular rate and rhythm  Resp:  Normal effort.  Equal breath sounds bilaterally.  Abd:  No distention.  Soft, nontender.  No rebound or guarding.   ED Results / Procedures / Treatments   EKG  EKG viewed and interpreted by myself shows a normal sinus rhythm at 100 bpm with a narrow QRS, left axis deviation, largely normal intervals with no  concerning ST changes.  RADIOLOGY  I have reviewed and interpreted chest x-ray images.  No obvious consolidation on my evaluation. Radiology is read the x-ray is questionable nodular focus in the suprahilar region of the right and left lung base.   MEDICATIONS ORDERED IN ED: Medications - No data to display   IMPRESSION / MDM / ASSESSMENT AND PLAN / ED COURSE  I reviewed the triage vital signs and the nursing notes.  Patient's presentation is most consistent with acute presentation with potential threat to life or bodily function.  Patient presents to the emergency department for intermittent chest discomfort over the past 4 months or so.  Patient also states intermittent trouble swallowing and when she feels like she has to spit up food.  Patient's lab work is reassuring overall reassuring CBC, reassuring chemistry, troponin negative.  Patient's workup is reassuring chest x-ray however showing these nodular opacities on x-ray given the patient's difficulty swallowing I discussed with the patient proceeding with a CT scan with contrast to rule out any mass tumor or other abnormal finding.  CT scan has  resulted overall reassuring results no significant finding.  Troponin negative x 2.  Patient has a cardiology follow-up already scheduled and will be following up with GI medicine as well states she is already spoken to them about doing an endoscopy.  We will discharge from the emergency department.  FINAL CLINICAL IMPRESSION(S) / ED DIAGNOSES   Chest pain    Note:  This document was prepared using Dragon voice recognition software and may include unintentional dictation errors.   Minna Antis, MD 12/08/22 2231

## 2022-12-08 NOTE — ED Triage Notes (Signed)
Pt to ed from [facility] for CP x 4 months. Pt was sent over from [facility]. Pt has a scheduled apt for cardio coming up but felt she couldn't wait. Pt is caox4, in no acute distress.

## 2022-12-08 NOTE — ED Provider Triage Note (Signed)
Emergency Medicine Provider Triage Evaluation Note  Rebekah Shaw , a 72 y.o. female  was evaluated in triage.  Pt complains of nausea and heartburn.  Patient reports 3 months of indigestion.  She reports having the urge to vomit up her meals 30 minutes after eating.  Denies abdominal pain at this times.  Endorses weight gain and many life stressors at this time.  Sent over by [place] clinic to get blood work, chest x-rays, and EKG per request of patient.  Patient denies chest pain, fever, diarrhea, urinary symptoms.  Physical Exam  BP (!) 154/78   Pulse 90   Temp 98.9 F (37.2 C) (Oral)   Resp 16   Ht 5' (1.524 m)   Wt 70 kg   SpO2 94%   BMI 30.14 kg/m  Gen:   Awake, no distress. Anxious. Resp:  Normal effort  MSK:   Moves extremities without difficulty     Medical Decision Making  Medically screening exam initiated at 6:51 PM.  Appropriate orders placed.  Rebekah Shaw was informed that the remainder of the evaluation will be completed by another provider, this initial triage assessment does not replace that evaluation, and the importance of remaining in the ED until their evaluation is complete.    Romeo Apple, Chyrl Elwell A, PA-C 12/08/22 1858

## 2022-12-08 NOTE — Discharge Instructions (Signed)
As we discussed please follow-up with your doctor for recheck/reevaluation.  Please follow-up with cardiology as scheduled.  Please follow-up with GI medicine for evaluation for consideration of possible endoscopy.

## 2022-12-12 ENCOUNTER — Ambulatory Visit: Payer: Medicare HMO

## 2022-12-20 ENCOUNTER — Ambulatory Visit: Payer: Medicare HMO

## 2022-12-26 ENCOUNTER — Ambulatory Visit: Payer: Medicare HMO

## 2023-01-02 ENCOUNTER — Ambulatory Visit: Payer: Medicare HMO

## 2023-01-09 ENCOUNTER — Ambulatory Visit: Payer: Medicare HMO

## 2023-01-17 ENCOUNTER — Ambulatory Visit: Payer: Medicare HMO

## 2023-01-23 ENCOUNTER — Ambulatory Visit: Payer: Medicare HMO

## 2023-01-30 ENCOUNTER — Ambulatory Visit: Payer: Medicare HMO

## 2023-02-06 ENCOUNTER — Ambulatory Visit: Payer: Medicare HMO

## 2023-02-13 ENCOUNTER — Ambulatory Visit: Payer: Medicare HMO

## 2023-02-20 ENCOUNTER — Ambulatory Visit: Payer: Medicare HMO

## 2023-02-27 ENCOUNTER — Ambulatory Visit: Payer: Medicare HMO

## 2023-03-01 ENCOUNTER — Other Ambulatory Visit: Payer: Self-pay | Admitting: Internal Medicine

## 2023-03-01 DIAGNOSIS — Z1231 Encounter for screening mammogram for malignant neoplasm of breast: Secondary | ICD-10-CM

## 2023-03-06 ENCOUNTER — Ambulatory Visit: Payer: Medicare HMO

## 2023-03-13 ENCOUNTER — Ambulatory Visit: Payer: Medicare HMO

## 2023-04-23 ENCOUNTER — Ambulatory Visit
Admission: RE | Admit: 2023-04-23 | Discharge: 2023-04-23 | Disposition: A | Discharge status: Home / Self Care | Payer: Medicare HMO | Source: Ambulatory Visit | Attending: Internal Medicine | Admitting: Internal Medicine

## 2023-04-23 DIAGNOSIS — Z1231 Encounter for screening mammogram for malignant neoplasm of breast: Secondary | ICD-10-CM | POA: Diagnosis present

## 2023-06-22 ENCOUNTER — Ambulatory Visit: Admit: 2023-06-22 | Payer: Medicare HMO | Admitting: Gastroenterology

## 2023-06-22 SURGERY — COLONOSCOPY WITH PROPOFOL
Anesthesia: General

## 2023-12-12 ENCOUNTER — Encounter: Payer: Self-pay | Admitting: Dermatology

## 2023-12-12 ENCOUNTER — Ambulatory Visit: Payer: Medicare HMO | Admitting: Dermatology

## 2023-12-12 DIAGNOSIS — Z1283 Encounter for screening for malignant neoplasm of skin: Secondary | ICD-10-CM | POA: Diagnosis not present

## 2023-12-12 DIAGNOSIS — L814 Other melanin hyperpigmentation: Secondary | ICD-10-CM

## 2023-12-12 DIAGNOSIS — D1801 Hemangioma of skin and subcutaneous tissue: Secondary | ICD-10-CM

## 2023-12-12 DIAGNOSIS — D229 Melanocytic nevi, unspecified: Secondary | ICD-10-CM

## 2023-12-12 DIAGNOSIS — Z79899 Other long term (current) drug therapy: Secondary | ICD-10-CM

## 2023-12-12 DIAGNOSIS — W908XXA Exposure to other nonionizing radiation, initial encounter: Secondary | ICD-10-CM

## 2023-12-12 DIAGNOSIS — L570 Actinic keratosis: Secondary | ICD-10-CM

## 2023-12-12 DIAGNOSIS — L821 Other seborrheic keratosis: Secondary | ICD-10-CM | POA: Diagnosis not present

## 2023-12-12 DIAGNOSIS — L578 Other skin changes due to chronic exposure to nonionizing radiation: Secondary | ICD-10-CM | POA: Diagnosis not present

## 2023-12-12 DIAGNOSIS — Z808 Family history of malignant neoplasm of other organs or systems: Secondary | ICD-10-CM

## 2023-12-12 DIAGNOSIS — Z7189 Other specified counseling: Secondary | ICD-10-CM

## 2023-12-12 DIAGNOSIS — L820 Inflamed seborrheic keratosis: Secondary | ICD-10-CM

## 2023-12-12 NOTE — Progress Notes (Signed)
 New Patient Visit   Subjective  Rebekah Shaw is a 73 y.o. female who presents for the following: Skin Cancer Screening and Full Body Skin Exam  The patient presents for Total-Body Skin Exam (TBSE) for skin cancer screening and mole check. The patient has spots, moles and lesions to be evaluated, some may be new or changing and the patient may have concern these could be cancer.  The following portions of the chart were reviewed this encounter and updated as appropriate: medications, allergies, medical history  Review of Systems:  No other skin or systemic complaints except as noted in HPI or Assessment and Plan.  Objective  Well appearing patient in no apparent distress; mood and affect are within normal limits.  A full examination was performed including scalp, head, eyes, ears, nose, lips, neck, chest, axillae, abdomen, back, buttocks, bilateral upper extremities, bilateral lower extremities, hands, feet, fingers, toes, fingernails, and toenails. All findings within normal limits unless otherwise noted below.   Relevant physical exam findings are noted in the Assessment and Plan.  Back and shoulders x 23, L inframammary x 3, L flank x 2, R flank x 5, hand x 2, R cheek x 1, scalp x 4, R lower leg x 5 (44) Erythematous stuck-on, waxy papule or plaque Face x 1 Erythematous thin papules/macules with gritty scale.  Hands x 32, neck x 10.  COSMETIC $350 (42) Stuck-on, waxy, tan-brown papule or plaque --Discussed benign etiology and prognosis.   Assessment & Plan   SKIN CANCER SCREENING PERFORMED TODAY.  ACTINIC DAMAGE - Chronic condition, secondary to cumulative UV/sun exposure - diffuse scaly erythematous macules with underlying dyspigmentation - Recommend daily broad spectrum sunscreen SPF 30+ to sun-exposed areas, reapply every 2 hours as needed.  - Staying in the shade or wearing long sleeves, sun glasses (UVA+UVB protection) and wide brim hats (4-inch brim around the entire  circumference of the hat) are also recommended for sun protection.  - Call for new or changing lesions.  LENTIGINES, SEBORRHEIC KERATOSES, HEMANGIOMAS - Benign normal skin lesions - Benign-appearing - Call for any changes  MELANOCYTIC NEVI - Tan-brown and/or pink-flesh-colored symmetric macules and papules - Benign appearing on exam today - Observation - Call clinic for new or changing moles - Recommend daily use of broad spectrum spf 30+ sunscreen to sun-exposed areas.   SEBORRHEIC KERATOSIS - Stuck-on, waxy, tan-brown papules and/or plaques  - Benign-appearing - Discussed benign etiology and prognosis. - Observe - Call for any changes - Recommend OTC Voltaren  gel to aa BID.  INFLAMED SEBORRHEIC KERATOSIS (44) Back and shoulders x 23, L inframammary x 3, L flank x 2, R flank x 5, hand x 2, R cheek x 1, scalp x 4, R lower leg x 5 (44) Symptomatic, irritating, patient would like treated.  Destruction of lesion - Back and shoulders x 23, L inframammary x 3, L flank x 2, R flank x 5, hand x 2, R cheek x 1, scalp x 4, R lower leg x 5 (44) Complexity: simple   Destruction method: cryotherapy   Informed consent: discussed and consent obtained   Timeout:  patient name, date of birth, surgical site, and procedure verified Lesion destroyed using liquid nitrogen: Yes   Region frozen until ice ball extended beyond lesion: Yes   Outcome: patient tolerated procedure well with no complications   Post-procedure details: wound care instructions given   AK (ACTINIC KERATOSIS) Face x 1 Actinic keratoses are precancerous spots that appear secondary to cumulative UV radiation  exposure/sun exposure over time. They are chronic with expected duration over 1 year. A portion of actinic keratoses will progress to squamous cell carcinoma of the skin. It is not possible to reliably predict which spots will progress to skin cancer and so treatment is recommended to prevent development of skin  cancer.  Recommend daily broad spectrum sunscreen SPF 30+ to sun-exposed areas, reapply every 2 hours as needed.  Recommend staying in the shade or wearing long sleeves, sun glasses (UVA+UVB protection) and wide brim hats (4-inch brim around the entire circumference of the hat). Call for new or changing lesions.  Destruction of lesion - Face x 1 Complexity: simple   Destruction method: cryotherapy   Informed consent: discussed and consent obtained   Timeout:  patient name, date of birth, surgical site, and procedure verified Lesion destroyed using liquid nitrogen: Yes   Region frozen until ice ball extended beyond lesion: Yes   Outcome: patient tolerated procedure well with no complications   Post-procedure details: wound care instructions given   SEBORRHEIC KERATOSIS (42) Hands x 32, neck x 10.  COSMETIC $350 (42) SEBORRHEIC KERATOSIS - Stuck-on, waxy, tan-brown papules and/or plaques  - Benign-appearing - Discussed benign etiology and prognosis. - Observe - Call for any changes - Discussed cosmetic treatment, not covered by insurance, $60 for first lesion and $15 for each lesion thereafter  FAMILY HISTORY OF SKIN CANCER What type(s):Melanoma Who affected:Father  Return for for SK follow up in 3 months.  Arlinda Lais, CMA, am acting as scribe for Celine Collard, MD .   Documentation: I have reviewed the above documentation for accuracy and completeness, and I agree with the above.  Celine Collard, MD

## 2023-12-12 NOTE — Patient Instructions (Addendum)
 Recommend over the counter Voltaren  gel daily to crusty lesions on the back and shoulders.   Due to recent changes in healthcare laws, you may see results of your pathology and/or laboratory studies on MyChart before the doctors have had a chance to review them. We understand that in some cases there may be results that are confusing or concerning to you. Please understand that not all results are received at the same time and often the doctors may need to interpret multiple results in order to provide you with the best plan of care or course of treatment. Therefore, we ask that you please give us  2 business days to thoroughly review all your results before contacting the office for clarification. Should we see a critical lab result, you will be contacted sooner.   If You Need Anything After Your Visit  If you have any questions or concerns for your doctor, please call our main line at [phone] and press option 4 to reach your doctor's medical assistant. If no one answers, please leave a voicemail as directed and we will return your call as soon as possible. Messages left after 4 pm will be answered the following business day.   You may also send us  a message via MyChart. We typically respond to MyChart messages within 1-2 business days.  For prescription refills, please ask your pharmacy to contact our office. Our fax number is [phone].  If you have an urgent issue when the clinic is closed that cannot wait until the next business day, you can page your doctor at the number below.    Please note that while we do our best to be available for urgent issues outside of office hours, we are not available 24/7.   If you have an urgent issue and are unable to reach us , you may choose to seek medical care at your doctor's office, retail clinic, urgent care center, or emergency room.  If you have a medical emergency, please immediately call 911 or go to the emergency department.  Pager  Numbers  - Dr. Bary Likes: [phone]  - Dr. Annette Barters: [phone]  - Dr. Felipe Horton: [phone]   In the event of inclement weather, please call our main line at [phone] for an update on the status of any delays or closures.  Dermatology Medication Tips: Please keep the boxes that topical medications come in in order to help keep track of the instructions about where and how to use these. Pharmacies typically print the medication instructions only on the boxes and not directly on the medication tubes.   If your medication is too expensive, please contact our office at [phone] option 4 or send us  a message through MyChart.   We are unable to tell what your co-pay for medications will be in advance as this is different depending on your insurance coverage. However, we may be able to find a substitute medication at lower cost or fill out paperwork to get insurance to cover a needed medication.   If a prior authorization is required to get your medication covered by your insurance company, please allow us  1-2 business days to complete this process.  Drug prices often vary depending on where the prescription is filled and some pharmacies may offer cheaper prices.  The website www.goodrx.com contains coupons for medications through different pharmacies. The prices here do not account for what the cost may be with help from insurance (it may be cheaper with your insurance), but the website can give you the price if  you did not use any insurance.  - You can print the associated coupon and take it with your prescription to the pharmacy.  - You may also stop by our office during regular business hours and pick up a GoodRx coupon card.  - If you need your prescription sent electronically to a different pharmacy, notify our office through [facility] or by phone at [phone] option 4.     Si Usted Necesita Algo Despus de Su Visita  Tambin puede enviarnos un mensaje a travs de  Clinical cytogeneticist. Por lo general respondemos a los mensajes de MyChart en el transcurso de 1 a 2 das hbiles.  Para renovar recetas, por favor pida a su farmacia que se ponga en contacto con nuestra oficina. Franz Jacks de fax es [place] [phone].  Si tiene un asunto urgente cuando la clnica est cerrada y que no puede esperar hasta el siguiente da hbil, puede llamar/localizar a su doctor(a) al nmero que aparece a continuacin.   Por favor, tenga en cuenta que aunque hacemos todo lo posible para estar disponibles para asuntos urgentes fuera del horario de [place], no estamos disponibles las 24 horas del da, los 7 [address]  [address] de la [place].   Si tiene un problema urgente y no puede comunicarse con nosotros, puede optar por buscar atencin mdica  en el consultorio de su doctor(a), en una clnica privada, en un centro de atencin urgente o en una sala de emergencias.  Si tiene Engineer, drilling, por favor llame inmediatamente al 911 o vaya a la sala de emergencias.  Nmeros de bper  - Dr. Bary Likes: [phone]  - Dra. Annette Barters: [phone]  - Dr. Felipe Horton: [phone]   En caso de inclemencias del tiempo, por favor llame a Lajuan Pila principal al [phone] para una actualizacin sobre el [place] de cualquier retraso o cierre.  Consejos para la medicacin en dermatologa: Por favor, guarde las cajas en las que vienen los medicamentos de uso tpico para ayudarle a seguir las instrucciones sobre dnde y cmo usarlos. Las farmacias generalmente imprimen las instrucciones del medicamento slo en las cajas y no directamente en los tubos del [place].   Si su medicamento es muy caro, por favor, pngase en contacto con Bettyjane Brunet llamando al [phone] y presione la opcin 4 o envenos un mensaje a travs de Clinical cytogeneticist.   No podemos decirle cul ser su copago por los medicamentos por adelantado ya que esto es diferente dependiendo de la cobertura de su seguro. Sin embargo, es posible que  podamos encontrar un medicamento sustituto a Audiological scientist un formulario para que el seguro cubra el medicamento que se considera necesario.   Si se requiere una autorizacin previa para que su compaa de seguros [place] su medicamento, por favor permtanos de 1 a 2 das hbiles para completar este proceso.  Los precios de los medicamentos varan con frecuencia dependiendo del Environmental consultant de dnde se surte la receta y alguna farmacias pueden ofrecer precios ms baratos.  El sitio web www.goodrx.com tiene cupones para medicamentos de Health and safety inspector. Los precios aqu no tienen en cuenta lo que podra costar con la ayuda del seguro (puede ser ms barato con su seguro), pero el sitio web puede darle el precio si no utiliz Tourist information centre manager.  - Puede imprimir el cupn correspondiente y llevarlo con su receta a la farmacia.  - Tambin puede pasar por nuestra oficina durante el horario de atencin regular y Education officer, museum una tarjeta de cupones de GoodRx.  - Si necesita que su  receta se enve electrnicamente a una farmacia diferente, informe a nuestra oficina a travs de MyChart de [place] o por telfono llamando al [phone] y presione la opcin 4.

## 2024-03-06 ENCOUNTER — Other Ambulatory Visit: Payer: Self-pay | Admitting: Internal Medicine

## 2024-03-06 DIAGNOSIS — Z1231 Encounter for screening mammogram for malignant neoplasm of breast: Secondary | ICD-10-CM

## 2024-03-20 ENCOUNTER — Ambulatory Visit: Admitting: Dermatology

## 2024-04-24 ENCOUNTER — Ambulatory Visit
Admission: RE | Admit: 2024-04-24 | Discharge: 2024-04-24 | Disposition: A | Discharge status: Home / Self Care | Source: Ambulatory Visit | Attending: Internal Medicine | Admitting: Internal Medicine

## 2024-04-24 DIAGNOSIS — Z1231 Encounter for screening mammogram for malignant neoplasm of breast: Secondary | ICD-10-CM | POA: Diagnosis present

## 2024-05-01 ENCOUNTER — Other Ambulatory Visit: Payer: Self-pay | Admitting: Internal Medicine

## 2024-05-01 DIAGNOSIS — R928 Other abnormal and inconclusive findings on diagnostic imaging of breast: Secondary | ICD-10-CM

## 2024-05-02 ENCOUNTER — Ambulatory Visit
Admission: RE | Admit: 2024-05-02 | Discharge: 2024-05-02 | Disposition: A | Discharge status: Home / Self Care | Source: Ambulatory Visit | Attending: Internal Medicine | Admitting: Internal Medicine

## 2024-05-02 ENCOUNTER — Other Ambulatory Visit: Payer: Self-pay | Admitting: Internal Medicine

## 2024-05-02 DIAGNOSIS — R928 Other abnormal and inconclusive findings on diagnostic imaging of breast: Secondary | ICD-10-CM

## 2024-05-02 HISTORY — PX: BREAST BIOPSY: SHX20

## 2024-05-02 MED ORDER — LIDOCAINE-EPINEPHRINE 1 %-1:100000 IJ SOLN
10.0000 mL | Freq: Once | INTRAMUSCULAR | Status: AC
Start: 2024-05-02 — End: 2024-05-02
  Administered 2024-05-02: 10 mL via INTRADERMAL

## 2024-05-02 MED ORDER — LIDOCAINE 1 % OPTIME INJ - NO CHARGE
2.0000 mL | Freq: Once | INTRAMUSCULAR | Status: AC
  Administered 2024-05-02: 2 mL via INTRADERMAL
  Filled 2024-05-02: qty 2

## 2024-05-05 LAB — SURGICAL PATHOLOGY

## 2024-07-15 ENCOUNTER — Other Ambulatory Visit: Payer: Self-pay | Admitting: Internal Medicine

## 2024-07-15 DIAGNOSIS — R413 Other amnesia: Secondary | ICD-10-CM

## 2024-07-15 DIAGNOSIS — I10 Essential (primary) hypertension: Secondary | ICD-10-CM

## 2024-12-17 ENCOUNTER — Ambulatory Visit: Admitting: Dermatology
